# Patient Record
Sex: Male | Born: 1960 | Race: White | Hispanic: No | Marital: Married | Smoking: Never smoker
Health system: Southern US, Community
[De-identification: ages and names within clinical notes are randomized; demographics above are authoritative.]

## PROBLEM LIST (undated history)

## (undated) DIAGNOSIS — G4700 Insomnia, unspecified: Secondary | ICD-10-CM

## (undated) DIAGNOSIS — I10 Essential (primary) hypertension: Secondary | ICD-10-CM

## (undated) DIAGNOSIS — F419 Anxiety disorder, unspecified: Secondary | ICD-10-CM

## (undated) HISTORY — DX: Anxiety disorder, unspecified: F41.9

## (undated) HISTORY — PX: NO PAST SURGERIES: SHX2092

## (undated) HISTORY — DX: Essential (primary) hypertension: I10

## (undated) HISTORY — DX: Insomnia, unspecified: G47.00

---

## 2004-04-11 ENCOUNTER — Emergency Department (HOSPITAL_COMMUNITY): Admission: EM | Admit: 2004-04-11 | Discharge: 2004-04-11 | Payer: Self-pay | Admitting: Family Medicine

## 2004-09-11 ENCOUNTER — Emergency Department (HOSPITAL_COMMUNITY): Admission: EM | Admit: 2004-09-11 | Discharge: 2004-09-11 | Payer: Self-pay | Admitting: Family Medicine

## 2005-03-02 ENCOUNTER — Ambulatory Visit: Payer: Self-pay | Admitting: Internal Medicine

## 2005-07-20 ENCOUNTER — Ambulatory Visit: Payer: Self-pay | Admitting: Internal Medicine

## 2005-11-15 ENCOUNTER — Emergency Department (HOSPITAL_COMMUNITY): Admission: EM | Admit: 2005-11-15 | Discharge: 2005-11-15 | Payer: Self-pay | Admitting: Family Medicine

## 2006-12-13 ENCOUNTER — Ambulatory Visit: Payer: Self-pay | Admitting: Internal Medicine

## 2006-12-13 DIAGNOSIS — J309 Allergic rhinitis, unspecified: Secondary | ICD-10-CM | POA: Insufficient documentation

## 2006-12-13 DIAGNOSIS — F419 Anxiety disorder, unspecified: Secondary | ICD-10-CM | POA: Insufficient documentation

## 2007-01-02 ENCOUNTER — Ambulatory Visit: Payer: Self-pay | Admitting: Internal Medicine

## 2007-01-02 ENCOUNTER — Encounter: Payer: Self-pay | Admitting: Internal Medicine

## 2007-05-30 ENCOUNTER — Telehealth (INDEPENDENT_AMBULATORY_CARE_PROVIDER_SITE_OTHER): Payer: Self-pay | Admitting: *Deleted

## 2007-05-30 ENCOUNTER — Encounter (INDEPENDENT_AMBULATORY_CARE_PROVIDER_SITE_OTHER): Payer: Self-pay | Admitting: *Deleted

## 2007-06-26 ENCOUNTER — Telehealth (INDEPENDENT_AMBULATORY_CARE_PROVIDER_SITE_OTHER): Payer: Self-pay | Admitting: *Deleted

## 2007-07-29 ENCOUNTER — Telehealth: Payer: Self-pay | Admitting: Internal Medicine

## 2007-08-05 ENCOUNTER — Ambulatory Visit: Payer: Self-pay | Admitting: Internal Medicine

## 2008-02-26 ENCOUNTER — Encounter (INDEPENDENT_AMBULATORY_CARE_PROVIDER_SITE_OTHER): Payer: Self-pay | Admitting: *Deleted

## 2008-05-15 ENCOUNTER — Ambulatory Visit: Payer: Self-pay | Admitting: Internal Medicine

## 2008-05-25 ENCOUNTER — Emergency Department (HOSPITAL_COMMUNITY): Admission: EM | Admit: 2008-05-25 | Discharge: 2008-05-25 | Payer: Self-pay | Admitting: Family Medicine

## 2008-11-16 ENCOUNTER — Ambulatory Visit: Payer: Self-pay | Admitting: Internal Medicine

## 2008-11-19 LAB — CONVERTED CEMR LAB
Basophils Absolute: 0 10*3/uL (ref 0.0–0.1)
Bilirubin, Direct: 0.1 mg/dL (ref 0.0–0.3)
Calcium: 9.5 mg/dL (ref 8.4–10.5)
Cholesterol: 207 mg/dL (ref 0–200)
Creatinine, Ser: 1.1 mg/dL (ref 0.4–1.5)
Direct LDL: 144 mg/dL
GFR calc Af Amer: 92 mL/min
HCT: 46.2 % (ref 39.0–52.0)
Hemoglobin: 16.2 g/dL (ref 13.0–17.0)
Hgb A1c MFr Bld: 5.5 % (ref 4.6–6.0)
MCHC: 35.1 g/dL (ref 30.0–36.0)
MCV: 94.6 fL (ref 78.0–100.0)
Monocytes Absolute: 0.5 10*3/uL (ref 0.1–1.0)
Neutro Abs: 4.6 10*3/uL (ref 1.4–7.7)
PSA: 0.77 ng/mL (ref 0.10–4.00)
RDW: 12.2 % (ref 11.5–14.6)
Sodium: 138 meq/L (ref 135–145)
Total Bilirubin: 0.7 mg/dL (ref 0.3–1.2)
Total Protein: 7.4 g/dL (ref 6.0–8.3)

## 2009-08-13 ENCOUNTER — Telehealth (INDEPENDENT_AMBULATORY_CARE_PROVIDER_SITE_OTHER): Payer: Self-pay | Admitting: *Deleted

## 2009-12-21 ENCOUNTER — Ambulatory Visit: Payer: Self-pay | Admitting: Internal Medicine

## 2010-03-23 ENCOUNTER — Telehealth (INDEPENDENT_AMBULATORY_CARE_PROVIDER_SITE_OTHER): Payer: Self-pay | Admitting: *Deleted

## 2010-03-25 ENCOUNTER — Encounter: Payer: Self-pay | Admitting: Internal Medicine

## 2010-06-02 ENCOUNTER — Telehealth: Payer: Self-pay | Admitting: Internal Medicine

## 2010-06-14 ENCOUNTER — Telehealth (INDEPENDENT_AMBULATORY_CARE_PROVIDER_SITE_OTHER): Payer: Self-pay | Admitting: *Deleted

## 2010-06-16 ENCOUNTER — Encounter: Payer: Self-pay | Admitting: Internal Medicine

## 2010-07-07 ENCOUNTER — Telehealth (INDEPENDENT_AMBULATORY_CARE_PROVIDER_SITE_OTHER): Payer: Self-pay | Admitting: *Deleted

## 2010-07-21 ENCOUNTER — Encounter: Payer: Self-pay | Admitting: Internal Medicine

## 2010-07-21 ENCOUNTER — Telehealth: Payer: Self-pay | Admitting: Internal Medicine

## 2010-07-23 ENCOUNTER — Encounter: Payer: Self-pay | Admitting: Internal Medicine

## 2010-07-29 ENCOUNTER — Ambulatory Visit: Payer: Self-pay | Admitting: Internal Medicine

## 2010-10-10 ENCOUNTER — Emergency Department (HOSPITAL_COMMUNITY)
Admission: EM | Admit: 2010-10-10 | Discharge: 2010-10-10 | Payer: Self-pay | Source: Home / Self Care | Admitting: Family Medicine

## 2010-10-18 ENCOUNTER — Telehealth: Payer: Self-pay | Admitting: Internal Medicine

## 2010-11-10 NOTE — Progress Notes (Signed)
Summary: Miguel Brooks (in process)  Phone Note Refill Request Message from:  Fax from Pharmacy  Refills Requested: Medication #1:  LEXAPRO 10 MG TABS Take 1 1/2 tablet a day*OFFICE VISIT DUE NOW **. [facility], [address], [place] = [phone] PRIOR AUTHORIZATION----PLAN # IS [phone]    PATIENT ID# =[id number]  Initial call taken by: Jerolyn Shin,  June 14, 2010 10:46 AM  Follow-up for Phone Call        In process, faxing over forms to be filled out. Army Fossa CMA  June 14, 2010 3:53 PM  patient called to check if med was authorized - he is out of pills - please call him when ins responds his cell [phone] ok to lleave msg .Marland KitchenOkey Regal [place]  June 15, 2010 1:27 PM   Additional Follow-up for Phone Call Additional follow up Details #1::        Medication was approved, pt aware, refill sent to pharm. Army Fossa CMA  June 16, 2010 8:25 AM     Prescriptions: LEXAPRO 10 MG TABS (ESCITALOPRAM OXALATE) Take 1 1/2 tablet a day*OFFICE VISIT DUE NOW **  #45 x 1   Entered by:   Army Fossa CMA   Authorized by:   Nolon Rod. Paz MD   Signed by:   Army Fossa CMA on 06/16/2010   Method used:   Electronically to        [facility] #[id number]* (retail)       [address] [place], [place]  [address]       Ph: [phone]       Fax: [phone]   RxID:   [id number]

## 2010-11-10 NOTE — Progress Notes (Signed)
Summary: LEXAPRO REFILL---NEEDS PRIOR AUTH--PT IS OUT OF MED  Phone Note Refill Request Call back at [facility] [phone] Message from:  Fax from Pharmacy on July 21, 2010 11:36 AM  Refills Requested: Medication #1:  LEXAPRO 10 MG TABS Take 1 1/2 tablet a day*OFFICE VISIT DUE NOW **. [facility], [address], [place], [place] [phone]     *****NEEDS PRIOR AUTH****    [id number]    PT ID# [id number] PATIENT'S WIFE CALLED TO ASK ABOUT PRESCRIPTION---SAYS PATIENT IS OUT---HAS OFFICE VISIT SCHED FOR 10/21 AT 3:20  Initial call taken by: Jerolyn Shin,  July 21, 2010 11:38 AM  Follow-up for Phone Call        Forms requested. Liberty Stead CMA  July 21, 2010 11:59 AM   Forms completed and faxed, will await insurance company reply. Eden Rho CMA  July 21, 2010 3:28 PM      Appended Document: LEXAPRO REFILL---NEEDS PRIOR AUTH--PT IS OUT OF MED Approved until 2012.

## 2010-11-10 NOTE — Progress Notes (Signed)
Summary: appt--lmom 9/29--CALLED BACK  Phone Note Outgoing Call   Call placed by: Army Fossa CMA,  July 07, 2010 9:51 AM Summary of Call: Please call and have pt call make an appt for f/u. Army Fossa CMA  July 07, 2010 9:51 AM   Follow-up for Phone Call        [facility] ON [phone] TO SCHEDULE FOLLOWUP APPT--VERIFY [phone].Jerolyn Shin  July 07, 2010 11:59 AM  Additional Follow-up for Phone Call Additional follow up Details #1::        APPT ON 10/21 @3 :20--VERIFIED PHONE NUMBERS Additional Follow-up by: Jerolyn Shin,  July 07, 2010 5:07 PM

## 2010-11-10 NOTE — Assessment & Plan Note (Signed)
Summary: MED REFILL, FOLLOWUP APPT///SP[facility]   Vital Signs:  Patient profile:   50 year old male Weight:      189.13 pounds Pulse rate:   78 / minute Pulse rhythm:   regular BP sitting:   130 / 80  (left arm) Cuff size:   large  Vitals Entered By: Army Fossa CMA (July 29, 2010 3:27 PM) CC: Pt here for f/u on meds- working well  Comments [facility] [address]    History of Present Illness: followup from the last office visit, since then he is doing great. He switched jobs, is much happier. Anxiety   well controlled   ROS Good compliance with medication Still has a difficult time falling asleep, takes half Ambien from time to time He is exercising more but diet  has not changed. Has gained some weight.  Current Medications (verified): 1)  Zolpidem Tartrate 10 Mg Tabs (Zolpidem Tartrate) .Marland Kitchen.. 1 By Mouth At Bedtime As Needed 2)  Lexapro 10 Mg Tabs (Escitalopram Oxalate) .... Take 1 1/2 Tablet A Day*office Visit Due Now **  Allergies (verified): No Known Drug Allergies  Past History:  Past Medical History: ANXIETY  INSOMNIA   ALLERGIC RHINITIS    Past Surgical History: Reviewed history from 11/16/2008 and no changes required. no  Social History: Married 2 kids lost father  (01-2008) former Retail banker, now employed by [employer]  Physical Exam  General:  alert and well-developed.  has gained wt  since his last office visit Lungs:  normal respiratory effort, no intercostal retractions, no accessory muscle use, and normal breath sounds.   Heart:  normal rate, regular rhythm, no murmur, and no gallop.   Psych:  Oriented X3, memory intact for recent and remote, normally interactive, good eye contact, not anxious appearing, and not depressed appearing.     Impression & Recommendations:  Problem # 1:  ANXIETY STATE NOS (ICD-300.00) definitely improved We will decrease Lexapro from 15 mg to 10 mg daily, reassess in 6  months, decrease Lexapro to 5mg ?  His updated medication list for this problem includes:    Lexapro 10 Mg Tabs (Escitalopram oxalate) .Marland Kitchen... 1 by mouth once daily  Problem # 2:  INSOMNIA (ICD-780.52) still having problems from time to time, rf zolpidem to be taken as needed His updated medication list for this problem includes:    Zolpidem Tartrate 10 Mg Tabs (Zolpidem tartrate) .Marland Kitchen... 1 by mouth at bedtime as needed  Complete Medication List: 1)  Zolpidem Tartrate 10 Mg Tabs (Zolpidem tartrate) .Marland Kitchen.. 1 by mouth at bedtime as needed 2)  Lexapro 10 Mg Tabs (Escitalopram oxalate) .Marland Kitchen.. 1 by mouth once daily  Patient Instructions: 1)  Please schedule a follow-up appointment in 6 months .  Prescriptions: ZOLPIDEM TARTRATE 10 MG TABS (ZOLPIDEM TARTRATE) 1 by mouth at bedtime as needed  #30 x 2   Entered and Authorized by:   Nolon Rod. Paz MD   Signed by:   Nolon Rod. Paz MD on 07/29/2010   Method used:   Print then Give to Patient   RxID:   [id number] LEXAPRO 10 MG TABS (ESCITALOPRAM OXALATE) 1 by mouth once daily  #30 x 6   Entered and Authorized by:   Nolon Rod. Paz MD   Signed by:   Nolon Rod. Paz MD on 07/29/2010   Method used:   Electronically to        [facility] #[id number]* (retail)  [address]       [place], [place]  [address]       Ph: [phone]       Fax: [phone]   RxID:   [id number]    Orders Added: 1)  Est. Patient Level III [60109]   Immunization History:  Influenza Immunization History:    Influenza:  historical (07/13/2010)   Immunization History:  Influenza Immunization History:    Influenza:  Historical (07/13/2010)

## 2010-11-10 NOTE — Medication Information (Signed)
Summary: Approval for Lexapro/[employer]  Approval for Lexapro/[employer]   Imported By: Lanelle Bal 08/03/2010 09:36:35  _____________________________________________________________________  External Attachment:    Type:   Image     Comment:   External Document

## 2010-11-10 NOTE — Progress Notes (Signed)
Summary: Refill Request/Med Change  Phone Note Refill Request Call back at [phone] Message from:  Hulan Fray on October 18, 2010 8:39 AM  Refills Requested: Medication #1:  LEXAPRO 10 MG TABS 1 by mouth once daily.   Dosage confirmed as above?Dosage Confirmed   Supply Requested: 45 Patient was starting to see negativer changes after 2 weeks of only taking 1 tablet a day and went back to taking 1 & 1/2 daily. He is now running low and needs a refill for #45, not #30. He wanted to make Dr. Drue Novel aware of the medication change as well. If any question call wife, Olegario Messier @ [phone].  [facility] [address]   Initial call taken by: Harold Barban,  October 18, 2010 8:40 AM  Follow-up for Phone Call        okay to go back to 15 mg of Lexapro. Called refills for 6 months Follow-up by: Taygen Newsome E. Rynlee Lisbon MD,  October 18, 2010 9:27 AM    New/Updated Medications: LEXAPRO 10 MG TABS (ESCITALOPRAM OXALATE) 1 1/2  by mouth once daily Prescriptions: LEXAPRO 10 MG TABS (ESCITALOPRAM OXALATE) 1 1/2  by mouth once daily  #45 x 5   Entered by:   Army Fossa CMA   Authorized by:   Nolon Rod. Yousaf Sainato MD   Signed by:   Army Fossa CMA on 10/18/2010   Method used:   Electronically to        [facility] #[id number]* (retail)       [address] [place], [place]  [address]       Ph: [phone]       Fax: [phone]   RxID:   [id number]

## 2010-11-10 NOTE — Medication Information (Signed)
Summary: Prior Authorization for Lexapro/[employer]  Prior Authorization for Lexapro/[employer]   Imported By: Lanelle Bal 07/28/2010 15:53:30  _____________________________________________________________________  External Attachment:    Type:   Image     Comment:   External Document

## 2010-11-10 NOTE — Progress Notes (Signed)
Summary: pa IN PROCESS [employer]-lexapro approved   Phone Note Refill Request Message from:  Pharmacy on March 23, 2010 2:27 PM  Refills Requested: Medication #1:  LEXAPRO 10 MG TABS 1/2 tablet once daily x 1week. Then 1 tab a day x 3 weeks. Then 1/5 tablet a day. Prior Auth [id number] Id#: [id number] [facility] [address]  Initial call taken by: Harold Barban,  March 23, 2010 2:27 PM  Follow-up for Phone Call        prior auth in process awaiting fax...........Marland KitchenFelecia Deloach CMA  March 23, 2010 3:33 PM  prior auth faxed back awaiting response.Felecia Deloach CMA  March 23, 2010 5:17 PM.   lexapro approved.Marland KitchenMarland KitchenDoristine Devoid  March 28, 2010 8:35 AM     New/Updated Medications: LEXAPRO 10 MG TABS (ESCITALOPRAM OXALATE) Take 1 1/2 tablet a day*OFFICE VISIT DUE NOW ** Prescriptions: LEXAPRO 10 MG TABS (ESCITALOPRAM OXALATE) Take 1 1/2 tablet a day*OFFICE VISIT DUE NOW **  #45 x 0   Entered by:   Jeremy Johann CMA   Authorized by:   Nolon Rod. Paz MD   Signed by:   Jeremy Johann CMA on 03/23/2010   Method used:   Re-Faxed to ...       [facility] [address] #[id number]* (retail)       [address] [place], [place]  [address]       Ph: [phone]       Fax: [phone]   RxID:   [id number]

## 2010-11-10 NOTE — Assessment & Plan Note (Signed)
Summary: DISCUSS LEXAPRO/RH.......Marland Kitchen   Vital Signs:  Patient profile:   50 year old male Height:      71.5 inches Weight:      178.2 pounds BMI:     24.60 Pulse rate:   84 / minute BP sitting:   120 / 80  Vitals Entered By: Shary Decamp (December 21, 2009 8:10 AM) CC: stress Comments  - weaned off lexapro about 1 month ago because he didn't feel like it was working  - increased stress with work & family  - pt states his mind is "always thinking" Shary Decamp  December 21, 2009 8:11 AM    History of Present Illness: the patient has been on Lexapro for a while and doing well (see  last office visit) financially she started with some problems in September an even with Lexapro he felt slightly anxious and depressed few weeks ago  he felt that he could do better with a Lexapro so he tapered himself down and he is completely off Lexapro for 3 weeks at this point he recognizes that he is more anxious, he is not sleeping well, he is a slightly depressed. He has a negative outlook in the future and has a decreased focus.  Current Medications (verified): 1)  Zolpidem Tartrate 10 Mg Tabs (Zolpidem Tartrate) .Marland Kitchen.. 1 By Mouth At Bedtime As Needed  Allergies (verified): No Known Drug Allergies  Past History:  Past Medical History: Reviewed history from 11/16/2008 and no changes required. ANXIETY STATE NOS  INSOMNIA   ALLERGIC RHINITIS (ICD-477.9)  Past Surgical History: Reviewed history from 11/16/2008 and no changes required. 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   Psych:  Oriented X3, memory intact for recent and remote, normally interactive, good eye contact, and not depressed appearing.   slightly anxious    Impression & Recommendations:  Problem # 1:  ANXIETY STATE NOS (ICD-300.00) we have a long conversation about his situation on looking back, he was doing okay on Lexapro 10 mg  until he faced financial stress.   He did not experience fatigue, sexual side effects or any other side effects on 10 mg of lexapro His options are -- restart Lexapro, goal 15 mg -- psychiatry referral -- counseling -- counseling and Lexapro patient elected counseling  and  Lexapro, will restart Lexapro gradually as he had side effects when he started 10 mg he will set up an appointment with a counselor in his church samples of lexapro #42  The following medications were removed from the medication list:    Lexapro 10 Mg Tabs (Escitalopram oxalate) .Marland Kitchen... 1 by mouth qd His updated medication list for this problem includes:    Lexapro 10 Mg Tabs (Escitalopram oxalate) .Marland Kitchen... 1/2 tablet once daily x 1week. then 1 tab a day x 3 weeks. then 1/5 tablet a day  Problem # 2:  face-to-face more than 15 minutes, more than 50% of the time counseling  Complete Medication List: 1)  Zolpidem Tartrate 10 Mg Tabs (Zolpidem tartrate) .Marland Kitchen.. 1 by mouth at bedtime as needed 2)  Lexapro 10 Mg Tabs (Escitalopram oxalate) .... 1/2 tablet once daily x 1week. then 1 tab a day x 3 weeks. then 1/5 tablet a day  Patient Instructions: 1)  lexapro 10mg : 1/2 tablet once daily x 1week. Then 1 tab a day x 3 weeks. Then 1/5 tablet a day  2)  Please schedule a follow-up appointment  in 2 months.  Prescriptions: LEXAPRO 10 MG TABS (ESCITALOPRAM OXALATE) 1/2 tablet once daily x 1week. Then 1 tab a day x 3 weeks. Then 1/5 tablet a day  #60 x 0   Entered and Authorized by:   Nolon Rod. Thessaly Mccullers MD   Signed by:   Nolon Rod. Kathia Covington MD on 12/21/2009   Method used:   Print then Give to Patient   RxID:   [id number] ZOLPIDEM TARTRATE 10 MG TABS (ZOLPIDEM TARTRATE) 1 by mouth at bedtime as needed  #30 x 6   Entered by:   Shary Decamp   Authorized by:   Nolon Rod. Daekwon Beswick MD   Signed by:   Shary Decamp on 12/21/2009   Method used:   Printed then faxed to ...       [facility] [address] #[id number]* (retail)       [address] [place], [place]  [address]       Ph: [phone]       Fax: [phone]   RxID:   [id number]

## 2010-11-10 NOTE — Medication Information (Signed)
Summary: Prior Authorization & Approval for Lexapro/[employer]  Prior Authorization & Approval for Lexapro/[employer]   Imported By: Lanelle Bal 03/31/2010 13:14:03  _____________________________________________________________________  External Attachment:    Type:   Image     Comment:   External Document

## 2010-11-10 NOTE — Progress Notes (Signed)
Summary: rx  Phone Note Call from Patient Call back at [phone]--cell   Caller: Patient Summary of Call: need a new scipt call in for lexapro  to be call in. need more than 15 pill call. 30 or 60 day supply. [facility] in to [facility] [place] [address] Initial call taken by: Freddy Jaksch,  June 02, 2010 1:46 PM  Follow-up for Phone Call        ok call #60  , no RF. Remind patient is due for OV Follow-up by: Jose E. Paz MD,  June 03, 2010 8:05 AM  Additional Follow-up for Phone Call Additional follow up Details #1::        Left detailed message for pt that refill was called in and we needed him to call back to make an appt. Army Fossa CMA  June 03, 2010 8:22 AM     Prescriptions: LEXAPRO 10 MG TABS (ESCITALOPRAM OXALATE) Take 1 1/2 tablet a day*OFFICE VISIT DUE NOW **  #60 x 0   Entered by:   Army Fossa CMA   Authorized by:   Nolon Rod. Paz MD   Signed by:   Army Fossa CMA on 06/03/2010   Method used:   Electronically to        [facility] [address] [phone]* (retail)       [address], [place]  [address]       Ph: [phone]       Fax: [phone]   RxID:   [id number]

## 2010-11-11 NOTE — Medication Information (Signed)
Summary: Approval for Lexapro/[employer]  Approval for Lexapro/[employer]   Imported By: Lanelle Bal 06/28/2010 08:41:05  _____________________________________________________________________  External Attachment:    Type:   Image     Comment:   External Document

## 2011-02-14 ENCOUNTER — Other Ambulatory Visit: Payer: Self-pay | Admitting: Internal Medicine

## 2011-02-14 NOTE — Telephone Encounter (Signed)
Ok 30, 2 RF. Also tell pt he is due for a check up, no urgent

## 2011-02-28 ENCOUNTER — Ambulatory Visit (INDEPENDENT_AMBULATORY_CARE_PROVIDER_SITE_OTHER): Payer: 59 | Admitting: Internal Medicine

## 2011-02-28 ENCOUNTER — Encounter: Payer: Self-pay | Admitting: Internal Medicine

## 2011-02-28 DIAGNOSIS — Z0000 Encounter for general adult medical examination without abnormal findings: Secondary | ICD-10-CM | POA: Insufficient documentation

## 2011-02-28 DIAGNOSIS — F411 Generalized anxiety disorder: Secondary | ICD-10-CM

## 2011-02-28 DIAGNOSIS — Z136 Encounter for screening for cardiovascular disorders: Secondary | ICD-10-CM

## 2011-02-28 DIAGNOSIS — J309 Allergic rhinitis, unspecified: Secondary | ICD-10-CM

## 2011-02-28 LAB — CBC WITH DIFFERENTIAL/PLATELET
Basophils Absolute: 0 10*3/uL (ref 0.0–0.1)
Eosinophils Relative: 2.9 % (ref 0.0–5.0)
HCT: 43.5 % (ref 39.0–52.0)
Lymphocytes Relative: 22.9 % (ref 12.0–46.0)
Lymphs Abs: 1.7 10*3/uL (ref 0.7–4.0)
Monocytes Relative: 6.7 % (ref 3.0–12.0)
Platelets: 294 10*3/uL (ref 150.0–400.0)
WBC: 7.6 10*3/uL (ref 4.5–10.5)

## 2011-02-28 LAB — LIPID PANEL
HDL: 39.4 mg/dL (ref >39.00)
LDL Cholesterol: 126 mg/dL — ABNORMAL HIGH (ref 0–99)
Total CHOL/HDL Ratio: 5

## 2011-02-28 LAB — BASIC METABOLIC PANEL
CO2: 29 mEq/L (ref 19–32)
Chloride: 105 mEq/L (ref 96–112)
Glucose, Bld: 84 mg/dL (ref 70–99)
Potassium: 4.1 mEq/L (ref 3.5–5.1)
Sodium: 141 mEq/L (ref 135–145)

## 2011-02-28 LAB — AST: AST: 24 U/L (ref 0–37)

## 2011-02-28 LAB — ALT: ALT: 25 U/L (ref 0–53)

## 2011-02-28 MED ORDER — FLUTICASONE FUROATE 27.5 MCG/SPRAY NA SUSP: 2.0000 | Freq: Every day | NASAL | Status: DC

## 2011-02-28 NOTE — Progress Notes (Signed)
  Subjective:    Patient ID: Miguel Brooks, male    DOB: 1961/07/11, 50 y.o.   MRN: [id number]  HPI Complete physical exam Complaints of sinus congestion and allergy symptoms despite using Zyrtec D. Wonders about a nasal steroid.  Past Medical History  Diagnosis Date  . Anxiety   . Insomnia   . Allergic rhinitis    No past surgical history on file.  Family History: colon ca--no prostate ca--no mixo-fibro-sarcoma-- father DM-- father  MI-- mother at 60  Social History: Married 2 kids, 2 GK lost father  (01-2008) Tobacco-- no ETOH-- rarely  Not exercising much Diet ok  Review of Systems No chest pain or shortness of breath No cough or chest congestion No nausea, vomiting, diarrhea. No blood in the stools. No dysuria or difficulty urinating. Denies anxiety or depression, has a great relationship with his wife    Objective:   Physical Exam  Constitutional: He is oriented to person, place, and time. He appears well-developed and well-nourished. No distress.  HENT:  Head: Normocephalic and atraumatic.  Nose: Nose normal.  Neck: No thyromegaly present.       Normal carotid pulses  Cardiovascular: Normal rate, regular rhythm and normal heart sounds.   No murmur heard. Pulmonary/Chest: Effort normal and breath sounds normal. No respiratory distress. He has no wheezes. He has no rales.  Abdominal: Soft. Bowel sounds are normal. He exhibits no distension. There is no tenderness. There is no rebound and no guarding.  Musculoskeletal: He exhibits no edema.  Neurological: He is oriented to person, place, and time.  Skin: Skin is warm and dry.  Psychiatric: He has a normal mood and affect. His behavior is normal. Thought content normal.          Assessment & Plan:

## 2011-02-28 NOTE — Assessment & Plan Note (Signed)
Doing well Update on his tetanus shot Continue with healthy diet, try to exercise more. EKG today normal. Labs.

## 2011-02-28 NOTE — Assessment & Plan Note (Addendum)
See history of present illness, symptoms not well-controlled. Trial  with Veramyst

## 2011-02-28 NOTE — Assessment & Plan Note (Signed)
Well controlled  with present meds, will RF meds whenever needed

## 2011-03-02 ENCOUNTER — Telehealth: Payer: Self-pay | Admitting: *Deleted

## 2011-03-02 NOTE — Telephone Encounter (Signed)
Message copied by Army Fossa on Thu Mar 02, 2011  9:14 AM ------      Message from: Willow Ora      Created: Wed Mar 01, 2011  5:12 PM       Advise patient:      His cholesterol has improved, only the TG are slt elevated but the LDL or bad cholesterol is now 124. At goal.      All other labs normal.      Good results !

## 2011-03-02 NOTE — Telephone Encounter (Signed)
Message left for patient to return my call.  

## 2011-03-03 NOTE — Telephone Encounter (Signed)
Pt is aware.  

## 2011-04-18 ENCOUNTER — Other Ambulatory Visit: Payer: Self-pay | Admitting: *Deleted

## 2011-04-18 MED ORDER — ESCITALOPRAM OXALATE 10 MG PO TABS: ORAL_TABLET | ORAL | Status: DC

## 2011-05-16 ENCOUNTER — Other Ambulatory Visit: Payer: Self-pay | Admitting: Internal Medicine

## 2011-05-17 NOTE — Telephone Encounter (Signed)
Ok #90, 1 RF . Please correct the prescription (delete the "needs an appointment")

## 2011-05-18 ENCOUNTER — Other Ambulatory Visit: Payer: Self-pay | Admitting: Internal Medicine

## 2011-05-18 NOTE — Telephone Encounter (Signed)
Please RF 

## 2011-05-18 NOTE — Telephone Encounter (Signed)
Patient called at noon today (8/9) to check on status of this prescription for Zolpidem---he is out and needs to pick prescription up after work today around 5:00PM--verified that pharmacy is correct

## 2011-05-19 ENCOUNTER — Other Ambulatory Visit: Payer: Self-pay | Admitting: *Deleted

## 2011-05-19 MED ORDER — ZOLPIDEM TARTRATE 10 MG PO TABS: 10.0000 mg | ORAL_TABLET | Freq: Every evening | ORAL | Status: DC | PRN

## 2011-07-13 ENCOUNTER — Other Ambulatory Visit: Payer: Self-pay | Admitting: Internal Medicine

## 2011-07-18 ENCOUNTER — Other Ambulatory Visit: Payer: Self-pay | Admitting: Internal Medicine

## 2011-07-19 NOTE — Telephone Encounter (Signed)
Ok 30, 6 RF 

## 2011-07-19 NOTE — Telephone Encounter (Signed)
Done

## 2011-10-16 ENCOUNTER — Other Ambulatory Visit: Payer: Self-pay | Admitting: Internal Medicine

## 2011-11-15 ENCOUNTER — Other Ambulatory Visit: Payer: Self-pay | Admitting: Internal Medicine

## 2011-11-16 NOTE — Telephone Encounter (Signed)
Refill done.  

## 2012-01-15 ENCOUNTER — Ambulatory Visit (INDEPENDENT_AMBULATORY_CARE_PROVIDER_SITE_OTHER): Payer: BC Managed Care – PPO | Admitting: Internal Medicine

## 2012-01-15 VITALS — BP 130/86 | HR 82 | Temp 98.2°F | Wt 192.0 lb

## 2012-01-15 DIAGNOSIS — K1120 Sialoadenitis, unspecified: Secondary | ICD-10-CM

## 2012-01-15 MED ORDER — AMOXICILLIN 500 MG PO CAPS: 1000.0000 mg | ORAL_CAPSULE | Freq: Two times a day (BID) | ORAL | Status: AC

## 2012-01-15 NOTE — Patient Instructions (Signed)
Parotitis  Parotitis is the irritation and puffiness (swelling) of one or both of the parotid glands. This is the main salivary gland in the mouth. Parotitis will cause the ear to be pushed up and out. HOME CARE  Put ice on the area.   Put ice in a plastic bag.   Place a towel between your skin and the bag.   Leave the ice on for 15 to 20 minutes, 3 to 4 times a day.   Only take medicine as told by your doctor.  GET HELP RIGHT AWAY IF:  You have more pain and puffiness in your gland that is not helped with medicine.   You have a fever.  MAKE SURE YOU:  Understand these instructions.   Will watch your condition.   Will get help right away if you are not doing well or get worse.  Document Released: 10/28/2010 Document Revised: 09/14/2011 Document Reviewed: 08/21/2011 [facility] Patient Information 2012 [place], [place].  Amoxicillin as prescribed Lemon or lime as needed

## 2012-01-15 NOTE — Progress Notes (Signed)
  Subjective:    Patient ID: Miguel Brooks, male    DOB: August 04, 1961, 51 y.o.   MRN: [id number]  HPI Acute visit 3 days ago noted swelling at the left side of the face. There was no pain, the area is less swelling now compared to last week.  Past Medical History  Diagnosis Date  . Anxiety   . Insomnia   . Allergic rhinitis      Review of Systems Denies any fever or chills. No sore throat or ear ache. No rash in the face. No dental pain.     Objective:   Physical Exam  Constitutional: He appears well-developed and well-nourished. No distress.  HENT:  Head: Normocephalic and atraumatic.    Right Ear: External ear normal.  Nose: Nose normal.  Mouth/Throat: Oropharynx is clear and moist.       Left tympanic membrane is slightly bulged but not red. Teeth not tender to percussion  Eyes: Conjunctivae are normal.  Skin: No rash noted. He is not diaphoretic.       Assessment & Plan:  Facial swelling most likely from parotitis, symptoms are now spontaneously getting better. Problem may be viral, less likely bacterial, due to a stone?. Plan: Antibiotics to suck on limes or lemons twice a day to call if not completely back to normal in a few days

## 2012-01-16 ENCOUNTER — Encounter: Payer: Self-pay | Admitting: Internal Medicine

## 2012-01-22 ENCOUNTER — Other Ambulatory Visit: Payer: Self-pay | Admitting: Internal Medicine

## 2012-01-22 NOTE — Telephone Encounter (Signed)
Refill request lexapro 10mg  #45 with 1 refill. Last refilled 2.6.13. OK to refill?

## 2012-01-23 NOTE — Telephone Encounter (Signed)
OK to fill? Please advise.

## 2012-01-23 NOTE — Telephone Encounter (Signed)
Pts wife called stating the pt needs this medication before 5 today. Please call her on her cell # [phone] when it has been sent.

## 2012-01-23 NOTE — Telephone Encounter (Signed)
Refill done.  

## 2012-02-19 ENCOUNTER — Telehealth: Payer: Self-pay | Admitting: Internal Medicine

## 2012-02-19 MED ORDER — ZOLPIDEM TARTRATE 10 MG PO TABS: ORAL_TABLET | ORAL | Status: DC

## 2012-02-19 NOTE — Telephone Encounter (Signed)
Refill: Zolpidem 10mg  tablet. Take 1 tablet by mouth every night at bedtime as needed for sleep. Qty 30. Last fill 01-14-12

## 2012-02-19 NOTE — Telephone Encounter (Signed)
Spoke with patient, patient aware rx responded to today. RX called into pharmacy

## 2012-02-19 NOTE — Telephone Encounter (Signed)
#

## 2012-02-19 NOTE — Telephone Encounter (Signed)
Ok to refill 

## 2012-02-20 ENCOUNTER — Other Ambulatory Visit: Payer: Self-pay | Admitting: Internal Medicine

## 2012-03-16 ENCOUNTER — Other Ambulatory Visit: Payer: Self-pay | Admitting: Internal Medicine

## 2012-04-19 ENCOUNTER — Other Ambulatory Visit: Payer: Self-pay | Admitting: Internal Medicine

## 2012-04-21 NOTE — Telephone Encounter (Signed)
Ok #30, 3 RF 

## 2012-04-22 NOTE — Telephone Encounter (Signed)
Rx sent 

## 2012-09-04 ENCOUNTER — Ambulatory Visit (INDEPENDENT_AMBULATORY_CARE_PROVIDER_SITE_OTHER): Payer: BC Managed Care – PPO | Admitting: Radiology

## 2012-09-04 DIAGNOSIS — Z23 Encounter for immunization: Secondary | ICD-10-CM

## 2012-09-13 ENCOUNTER — Other Ambulatory Visit: Payer: Self-pay | Admitting: Internal Medicine

## 2012-09-16 NOTE — Telephone Encounter (Signed)
Left detailed msg on pt's vmail.  

## 2012-09-16 NOTE — Telephone Encounter (Signed)
Call patient, make an appointment within 6 weeks for a complete physical exam. ok #30 and 1, no further refills with an appointment, let patient know

## 2012-09-16 NOTE — Telephone Encounter (Signed)
Ok to refill 

## 2012-12-08 ENCOUNTER — Telehealth: Payer: Self-pay | Admitting: Internal Medicine

## 2012-12-09 NOTE — Telephone Encounter (Signed)
i did #30, no RF, has an appointment for tomorrow

## 2012-12-09 NOTE — Telephone Encounter (Signed)
Ok to refill? Last OV 4.8.13 Last filled 12.6.13

## 2012-12-10 ENCOUNTER — Encounter: Payer: Self-pay | Admitting: Internal Medicine

## 2012-12-10 ENCOUNTER — Telehealth: Payer: Self-pay | Admitting: Internal Medicine

## 2012-12-10 ENCOUNTER — Ambulatory Visit (INDEPENDENT_AMBULATORY_CARE_PROVIDER_SITE_OTHER): Payer: BC Managed Care – PPO | Admitting: Internal Medicine

## 2012-12-10 VITALS — BP 136/84 | HR 70 | Ht 72.5 in | Wt 188.0 lb

## 2012-12-10 DIAGNOSIS — Z0000 Encounter for general adult medical examination without abnormal findings: Secondary | ICD-10-CM

## 2012-12-10 LAB — COMPREHENSIVE METABOLIC PANEL
AST: 20 U/L (ref 0–37)
Alkaline Phosphatase: 64 U/L (ref 39–117)
BUN: 18 mg/dL (ref 6–23)
Calcium: 9.3 mg/dL (ref 8.4–10.5)
Creatinine, Ser: 1.1 mg/dL (ref 0.4–1.5)
Total Bilirubin: 0.8 mg/dL (ref 0.3–1.2)

## 2012-12-10 LAB — PSA: PSA: 0.74 ng/mL (ref 0.10–4.00)

## 2012-12-10 LAB — LIPID PANEL
HDL: 36.3 mg/dL — ABNORMAL LOW (ref >39.00)
VLDL: 24.4 mg/dL (ref 0.0–40.0)

## 2012-12-10 LAB — CBC WITH DIFFERENTIAL/PLATELET
Basophils Relative: 0.4 % (ref 0.0–3.0)
Eosinophils Absolute: 0.2 10*3/uL (ref 0.0–0.7)
HCT: 45.2 % (ref 39.0–52.0)
Hemoglobin: 15.6 g/dL (ref 13.0–17.0)
Lymphocytes Relative: 12.6 % (ref 12.0–46.0)
Lymphs Abs: 1.3 10*3/uL (ref 0.7–4.0)
MCHC: 34.4 g/dL (ref 30.0–36.0)
MCV: 92.9 fl (ref 78.0–100.0)
Neutro Abs: 8.7 10*3/uL — ABNORMAL HIGH (ref 1.4–7.7)
RBC: 4.87 Mil/uL (ref 4.22–5.81)

## 2012-12-10 LAB — TSH: TSH: 1.05 u[IU]/mL (ref 0.35–5.50)

## 2012-12-10 MED ORDER — ESCITALOPRAM OXALATE 10 MG PO TABS: ORAL_TABLET | ORAL | Status: DC

## 2012-12-10 MED ORDER — ZOLPIDEM TARTRATE 10 MG PO TABS: ORAL_TABLET | ORAL | Status: DC

## 2012-12-10 NOTE — Progress Notes (Signed)
  Subjective:    Patient ID: Miguel Brooks, male    DOB: 11/03/1960, 52 y.o.   MRN: [id number]  HPI CPX  Past Medical History  Diagnosis Date  . Anxiety   . Insomnia   . Allergic rhinitis    Past Surgical History  Procedure Laterality Date  . No past surgeries     History   Social History  . Marital Status: Single    Spouse Name: N/A    Number of Children: 2  . Years of Education: N/A   Occupational History  . sales    .     Social History Main Topics  . Smoking status: Never Smoker   . Smokeless tobacco: Never Used  . Alcohol Use: Yes     Comment: rare  . Drug Use: No  . Sexually Active: Not on file   Other Topics Concern  . Not on file   Social History Narrative   Diet: usually healthy   Exercise: not much exercise lately   2 children (from wife), 3 Gkids               Family History  Problem Relation Age of Onset  . Colon cancer Neg Hx   . Prostate cancer Neg Hx   . Diabetes Father     borderline  . Heart attack Mother 55    pass away  . Stroke Neg Hx     Review of Systems  Respiratory: Negative for cough and shortness of breath.   Cardiovascular: Negative for chest pain and leg swelling.  Gastrointestinal: Negative for nausea, abdominal pain, diarrhea and blood in stool.  Genitourinary: Negative for dysuria, hematuria and difficulty urinating.  Psychiatric/Behavioral:       Sx well controlled        Objective:   Physical Exam General -- alert, well-developed, BMI 25  .   Neck --no thyromegaly , normal carotid pulse Lungs -- normal respiratory effort, no intercostal retractions, no accessory muscle use, and normal breath sounds.   Heart-- normal rate, regular rhythm, no murmur, and no gallop.   Abdomen--soft, non-tender, no distention, no masses, no HSM, no guarding, and no rigidity.   Extremities-- no pretibial edema bilaterally Rectal-- No external abnormalities noted. Normal sphincter tone. No rectal masses or tenderness. Worster  stool, Hemoccult negative Prostate:  Prostate gland firm and smooth, no enlargement, nodularity, tenderness, mass, asymmetry or induration. Neurologic-- alert & oriented X3 and strength normal in all extremities. Psych-- Cognition and judgment appear intact. Alert and cooperative with normal attention span and concentration.  not anxious appearing and not depressed appearing.       Assessment & Plan:

## 2012-12-10 NOTE — Assessment & Plan Note (Addendum)
Td 2006 Doing well cscope-- never , discussed Cscope vs iFOB, likes a scope, referral done  Labs  Diet very good, exercise discussed  RTC 1 year Chronic medical problems well-controlled.

## 2012-12-10 NOTE — Telephone Encounter (Signed)
stopped by check out to schedule physical for next yr., wanted me to send reminder to Dr.paz to enter referral for Colonoscopy

## 2012-12-10 NOTE — Telephone Encounter (Signed)
I will.

## 2012-12-12 ENCOUNTER — Encounter: Payer: Self-pay | Admitting: *Deleted

## 2013-02-20 ENCOUNTER — Encounter: Payer: Self-pay | Admitting: Internal Medicine

## 2013-02-21 ENCOUNTER — Encounter: Payer: Self-pay | Admitting: Internal Medicine

## 2013-03-04 ENCOUNTER — Encounter: Payer: Self-pay | Admitting: Internal Medicine

## 2013-03-04 ENCOUNTER — Telehealth: Payer: Self-pay | Admitting: Internal Medicine

## 2013-03-04 NOTE — Telephone Encounter (Signed)
In reference to Gastroenterology referral entered on 12/10/12, [facility] attempted to reach patient, left message for return call and no response.  I have attempted to reach patient several times by phone, left messages, and mailed patient a letter.  As of today, 03/04/13, patient will not respond.

## 2013-03-04 NOTE — Telephone Encounter (Signed)
Noted , we'll discuss on return to the office 

## 2013-05-28 ENCOUNTER — Telehealth: Payer: Self-pay | Admitting: Internal Medicine

## 2013-06-10 NOTE — Telephone Encounter (Signed)
error 

## 2013-07-13 ENCOUNTER — Other Ambulatory Visit: Payer: Self-pay | Admitting: Internal Medicine

## 2013-07-14 ENCOUNTER — Telehealth: Payer: Self-pay | Admitting: *Deleted

## 2013-07-14 NOTE — Telephone Encounter (Signed)
zolpidem (AMBIEN) 10 MG tablet Last OV: 12/10/2012 Last refill: 12/10/2012 No UDS on file

## 2013-07-14 NOTE — Telephone Encounter (Signed)
Okay to send #30 and one refill. Although I did not d/w at the time of his physical, he needs a contract, UDS per office policy;  please arrange at the patient's earliest convenience

## 2013-07-15 ENCOUNTER — Other Ambulatory Visit: Payer: Self-pay | Admitting: *Deleted

## 2013-07-15 ENCOUNTER — Telehealth: Payer: Self-pay | Admitting: *Deleted

## 2013-07-15 MED ORDER — ZOLPIDEM TARTRATE 10 MG PO TABS: ORAL_TABLET | ORAL | Status: DC

## 2013-07-15 NOTE — Telephone Encounter (Signed)
Ambien refilled and UDS contract printed for patient to sign.

## 2013-07-15 NOTE — Telephone Encounter (Signed)
Called and spoke with patient to inform him that his prescription for Ambien is available for pick up at our front desk. Also, that he needs to review and sign Controlled Substance Contract, urine sample required.

## 2013-08-14 ENCOUNTER — Other Ambulatory Visit: Payer: Self-pay

## 2013-09-21 ENCOUNTER — Other Ambulatory Visit: Payer: Self-pay | Admitting: Internal Medicine

## 2013-09-22 ENCOUNTER — Telehealth: Payer: Self-pay | Admitting: *Deleted

## 2013-09-22 MED ORDER — ZOLPIDEM TARTRATE 10 MG PO TABS: ORAL_TABLET | ORAL | Status: DC

## 2013-09-22 NOTE — Telephone Encounter (Signed)
rx refill- Ambien 10 mg Last ov- 12/10/12 Last refill- 07/15/13 #30 / 1 rf  UDS contract- 07/25/13

## 2013-09-22 NOTE — Telephone Encounter (Signed)
Okay to refill Ambien, see prescription. Does not need a UDS, he is only taking Ambien

## 2013-09-22 NOTE — Addendum Note (Signed)
Addended by: Willow Ora E on: 09/22/2013 12:54 PM   Modules accepted: Orders

## 2013-10-06 ENCOUNTER — Emergency Department (INDEPENDENT_AMBULATORY_CARE_PROVIDER_SITE_OTHER)
Admission: EM | Admit: 2013-10-06 | Discharge: 2013-10-06 | Disposition: A | Discharge status: Home / Self Care | Payer: BC Managed Care – PPO | Source: Home / Self Care

## 2013-10-06 ENCOUNTER — Encounter (HOSPITAL_COMMUNITY): Payer: Self-pay | Admitting: Emergency Medicine

## 2013-10-06 DIAGNOSIS — J329 Chronic sinusitis, unspecified: Secondary | ICD-10-CM

## 2013-10-06 MED ORDER — METHYLPREDNISOLONE 4 MG PO KIT: PACK | ORAL | Status: DC

## 2013-10-06 MED ORDER — AMOXICILLIN 500 MG PO CAPS: 1000.0000 mg | ORAL_CAPSULE | Freq: Two times a day (BID) | ORAL | Status: DC

## 2013-10-06 NOTE — ED Provider Notes (Signed)
CSN: [id number]     Arrival date & time 10/06/13  0906 History   First MD Initiated Contact with Patient 10/06/13 0945     Chief Complaint  Patient presents with  . Sinusitis   (Consider location/radiation/quality/duration/timing/severity/associated sxs/prior Treatment) HPI Comments: 52 year old male states 2 weeks ago that he was raking mulch in [place] and at the end of the day developed acute sinus congestion. Sinus congestion has persisted through the past 2 weeks and is experiencing sinus headache and pressure in the face. He describes yellow and green PND. He denies fever but has occasional frequent cough. He has been taking Mucinex D. and Mucinex DM with little to no benefit.   Past Medical History  Diagnosis Date  . Anxiety   . Insomnia   . Allergic rhinitis    Past Surgical History  Procedure Laterality Date  . No past surgeries     Family History  Problem Relation Age of Onset  . Colon cancer Neg Hx   . Prostate cancer Neg Hx   . Diabetes Father     borderline  . Heart attack Mother 77    pass away  . Stroke Neg Hx    History  Substance Use Topics  . Smoking status: Never Smoker   . Smokeless tobacco: Never Used  . Alcohol Use: Yes     Comment: rare    Review of Systems  Constitutional: Negative for fever, diaphoresis, activity change and fatigue.  HENT: Positive for congestion, postnasal drip, sore throat and trouble swallowing. Negative for ear pain, facial swelling and rhinorrhea.   Eyes: Negative for pain, discharge and redness.  Respiratory: Positive for cough. Negative for chest tightness, shortness of breath and wheezing.   Cardiovascular: Negative.   Gastrointestinal: Negative.   Musculoskeletal: Negative.  Negative for neck pain and neck stiffness.  Neurological: Negative.     Allergies  Aleve  Home Medications   Current Outpatient Rx  Name  Route  Sig  Dispense  Refill  . amoxicillin (AMOXIL) 500 MG capsule   Oral   Take 2 capsules (1,000  mg total) by mouth 2 (two) times daily.   28 capsule   0   . escitalopram (LEXAPRO) 10 MG tablet      TAKE 1 1/2 TABLETS BY MOUTH EVERY DAY.CURRENTLY WEANING OFF THIS MEDICINE-12/29         . methylPREDNISolone (MEDROL DOSEPAK) 4 MG tablet      follow package directions   21 tablet   0   . zolpidem (AMBIEN) 10 MG tablet      TAKE ONE TABLET BY MOUTH AT BEDTIME AS NEEDED FOR SLEEP   30 tablet   2    BP 161/85  Pulse 101  Temp(Src) 98 F (36.7 C) (Oral)  Resp 16  SpO2 99% Physical Exam  Nursing note and vitals reviewed. Constitutional: He is oriented to person, place, and time. He appears well-developed and well-nourished. No distress.  HENT:  Bilateral TMs are normal Oropharynx with minor smooth erythema without cobblestoning or exudates.  Neck: Normal range of motion. Neck supple.  Cardiovascular: Normal rate, regular rhythm and normal heart sounds.   Pulmonary/Chest: Effort normal and breath sounds normal. No respiratory distress. He has no wheezes. He has no rales.  Musculoskeletal: Normal range of motion. He exhibits no edema.  Lymphadenopathy:    He has cervical adenopathy.  Neurological: He is alert and oriented to person, place, and time.  Skin: Skin is warm and dry. No rash noted.  Psychiatric: He has a normal mood and affect.    ED Course  Procedures (including critical care time) Labs Review Labs Reviewed - No data to display Imaging Review No results found.    MDM   1. Rhinosinusitis      Amoxicillin 1 g twice a day for 7 days Medrol Dosepak as directed Alka-Seltzer cold plus nighttime relief Ibuprofen when necessary, drink plenty of fluids stay well hydrated.   Hayden Rasmussen, NP 10/06/13 1005

## 2013-10-06 NOTE — ED Notes (Signed)
Patient concerned he has sinusitis: patient has had symptoms for 2 weeks and relates onset to raking and mulching leaves.  Yard work followed by feeling bad, cough, sinus drainage, worsening cough, persistent headache, sore teeth, and feeling "worn out".  Sputum has been pale green to dark yellow and green.

## 2013-10-07 NOTE — ED Provider Notes (Signed)
Medical screening examination/treatment/procedure(s) were performed by non-physician practitioner and as supervising physician I was immediately available for consultation/collaboration.  Leslee Home, M.D.  Reuben Likes, MD 10/07/13 [phone]

## 2013-10-18 ENCOUNTER — Emergency Department (HOSPITAL_BASED_OUTPATIENT_CLINIC_OR_DEPARTMENT_OTHER): Payer: BC Managed Care – PPO

## 2013-10-18 ENCOUNTER — Encounter (HOSPITAL_BASED_OUTPATIENT_CLINIC_OR_DEPARTMENT_OTHER): Payer: Self-pay | Admitting: Emergency Medicine

## 2013-10-18 ENCOUNTER — Emergency Department (HOSPITAL_BASED_OUTPATIENT_CLINIC_OR_DEPARTMENT_OTHER)
Admission: EM | Admit: 2013-10-18 | Discharge: 2013-10-18 | Disposition: A | Discharge status: Home / Self Care | Payer: BC Managed Care – PPO | Attending: Emergency Medicine | Admitting: Emergency Medicine

## 2013-10-18 DIAGNOSIS — R05 Cough: Secondary | ICD-10-CM

## 2013-10-18 DIAGNOSIS — R0982 Postnasal drip: Secondary | ICD-10-CM | POA: Insufficient documentation

## 2013-10-18 DIAGNOSIS — G4700 Insomnia, unspecified: Secondary | ICD-10-CM | POA: Insufficient documentation

## 2013-10-18 DIAGNOSIS — R059 Cough, unspecified: Secondary | ICD-10-CM

## 2013-10-18 DIAGNOSIS — Z79899 Other long term (current) drug therapy: Secondary | ICD-10-CM | POA: Insufficient documentation

## 2013-10-18 DIAGNOSIS — J069 Acute upper respiratory infection, unspecified: Secondary | ICD-10-CM | POA: Insufficient documentation

## 2013-10-18 DIAGNOSIS — F411 Generalized anxiety disorder: Secondary | ICD-10-CM | POA: Insufficient documentation

## 2013-10-18 DIAGNOSIS — R000 Tachycardia, unspecified: Secondary | ICD-10-CM | POA: Insufficient documentation

## 2013-10-18 MED ORDER — HYDROCODONE-CHLORPHENIRAMINE 5-4 MG/5ML PO SOLN: 5.0000 mL | Freq: Four times a day (QID) | ORAL | Status: DC | PRN

## 2013-10-18 NOTE — ED Provider Notes (Signed)
CSN: [id number]     Arrival date & time 10/18/13  0801 History   First MD Initiated Contact with Patient 10/18/13 0809     Chief Complaint  Patient presents with  . Cough   (Consider location/radiation/quality/duration/timing/severity/associated sxs/prior Treatment) HPI Comments: 53 yo male with insomnia and allergic rhinitis hx presents with sinus congestion and worsening cough the past 5 days, worse at night. Pt had sinus infection, took amoxicillin and feels the infection now has moved to his chest, mild productive cough at night.  No sick contacts.  No travel.  Feels okay otherwise. No pertussis exposure. Finished amoxicillin.    Patient is a 53 y.o. male presenting with cough. The history is provided by the patient.  Cough Cough characteristics:  Productive Sputum characteristics:  Clear and yellow Severity:  Mild Onset quality:  Gradual Timing:  Intermittent Associated symptoms: no chills, no fever, no headaches and no shortness of breath     Past Medical History  Diagnosis Date  . Anxiety   . Insomnia   . Allergic rhinitis    Past Surgical History  Procedure Laterality Date  . No past surgeries     Family History  Problem Relation Age of Onset  . Colon cancer Neg Hx   . Prostate cancer Neg Hx   . Diabetes Father     borderline  . Heart attack Mother 4065    pass away  . Stroke Neg Hx    History  Substance Use Topics  . Smoking status: Never Smoker   . Smokeless tobacco: Never Used  . Alcohol Use: Yes     Comment: rare    Review of Systems  Constitutional: Negative for fever and chills.  HENT: Positive for congestion.   Respiratory: Positive for cough. Negative for shortness of breath.   Gastrointestinal: Negative for vomiting.  Musculoskeletal: Negative for neck stiffness.  Neurological: Negative for headaches.    Allergies  Aleve  Home Medications   Current Outpatient Rx  Name  Route  Sig  Dispense  Refill  . amoxicillin (AMOXIL) 500 MG capsule    Oral   Take 2 capsules (1,000 mg total) by mouth 2 (two) times daily.   28 capsule   0   . escitalopram (LEXAPRO) 10 MG tablet      TAKE 1 1/2 TABLETS BY MOUTH EVERY DAY.CURRENTLY WEANING OFF THIS MEDICINE-12/29         . methylPREDNISolone (MEDROL DOSEPAK) 4 MG tablet      follow package directions   21 tablet   0   . zolpidem (AMBIEN) 10 MG tablet      TAKE ONE TABLET BY MOUTH AT BEDTIME AS NEEDED FOR SLEEP   30 tablet   2    BP 150/98  Pulse 96  Temp(Src) 98.6 F (37 C) (Oral)  Resp 20  Ht 5\' 11"  (1.803 m)  Wt 187 lb (84.823 kg)  BMI 26.09 kg/m2  SpO2 98% Physical Exam  Nursing note and vitals reviewed. Constitutional: He appears well-developed and well-nourished.  HENT:  Head: Normocephalic and atraumatic.  No trismus, uvular deviation, unilateral posterior pharyngeal edema or submandibular swelling.   Eyes: Conjunctivae are normal. Right eye exhibits no discharge. Left eye exhibits no discharge.  Neck: Normal range of motion. Neck supple. No tracheal deviation present.  Cardiovascular: Regular rhythm.  Tachycardia present.   Pulmonary/Chest: Effort normal and breath sounds normal.  Musculoskeletal: He exhibits no edema.  Lymphadenopathy:    He has no cervical adenopathy.  Neurological: He  is alert.  Skin: Skin is warm. No rash noted.  Psychiatric: He has a normal mood and affect.    ED Course  Procedures (including critical care time) Labs Review Labs Reviewed - No data to display Imaging Review Dg Chest 2 View  10/18/2013   CLINICAL DATA:  Productive cough.  EXAM: CHEST  2 VIEW  COMPARISON:  None.  FINDINGS: The heart size and mediastinal contours are within normal limits. Both lungs are clear. The visualized skeletal structures are unremarkable.  IMPRESSION: No acute cardiopulmonary findings.   Electronically Signed   By: Loralie Champagne M.D.   On: 10/18/2013 08:41    EKG Interpretation   None       MDM   1. Post-nasal drip   2. Cough    3. URI, acute    Well appearing. Clinicaly URI, likely viral.  Pt not a dm, healthy. Discussed sinus drainage as likely causes of cough. CXR to look for occult pneumonia.  Pt does not require abx at this time. Discussed tea/ honey, pt wishes to try cough script. Fup for high bp reading discussed.   Results and differential diagnosis were discussed with the patient. Close follow up outpatient was discussed, patient comfortable with the plan.   Diagnosis:above     Enid Skeens, MD 10/18/13 [phone]

## 2013-10-18 NOTE — Discharge Instructions (Signed)
Try natural honey with tea. If you were given medicines take as directed.  If you are on coumadin or contraceptives realize their levels and effectiveness is altered by many different medicines.  If you have any reaction (rash, tongues swelling, other) to the medicines stop taking and see a physician.   Please follow up as directed and return to the ER or see a physician for new or worsening symptoms.  Thank you. Blood pressure was high in ED, have it rechecked by your physician.

## 2013-10-18 NOTE — ED Notes (Signed)
Pt states he has been sick since before christmas.  Productive cough, pale yellow green sputum.  Now having sore throat.  Pt treated on the 29th but not improving.

## 2013-12-12 ENCOUNTER — Encounter: Payer: BC Managed Care – PPO | Admitting: Internal Medicine

## 2013-12-20 ENCOUNTER — Other Ambulatory Visit: Payer: Self-pay | Admitting: Internal Medicine

## 2013-12-22 ENCOUNTER — Telehealth: Payer: Self-pay | Admitting: *Deleted

## 2013-12-22 MED ORDER — ZOLPIDEM TARTRATE 10 MG PO TABS: ORAL_TABLET | ORAL | Status: DC

## 2013-12-22 MED ORDER — ESCITALOPRAM OXALATE 10 MG PO TABS: ORAL_TABLET | ORAL | Status: DC

## 2013-12-22 NOTE — Telephone Encounter (Signed)
rx refill- Ambien 10 mg  Last OV- 12/10/12 Last refilled- 09/22/13 #30 / 2 rf.

## 2013-12-22 NOTE — Addendum Note (Signed)
Addended by: Willow OraPAZ, Karan Ramnauth E on: 12/22/2013 04:50 PM   Modules accepted: Orders

## 2013-12-22 NOTE — Telephone Encounter (Signed)
Printed prescription for 30 and one refill. Advise patient he is due for a physical, please arrange at his convenience

## 2013-12-22 NOTE — Addendum Note (Signed)
Addended by: Eustace QuailEABOLD, Toshika Parrow J on: 12/22/2013 05:02 PM   Modules accepted: Orders

## 2013-12-23 NOTE — Telephone Encounter (Signed)
rx faxed to [facility] . Pt notified and scheduled for CPE.

## 2014-01-22 ENCOUNTER — Telehealth: Payer: Self-pay

## 2014-01-22 NOTE — Telephone Encounter (Signed)
Medication List and allergies:  Reviewed and updated  90 day supply/mail order: na Local prescriptions: [facility] and [address]  Immunizations due: UTD  A/P:   No changes to FH, PSH or Personal Hx Flu vaccine--08/2013 Tdap--07/2005  CCS--never had one PSA--12/2012--0.74  To Discuss with Provider: [facility]

## 2014-01-23 ENCOUNTER — Encounter: Payer: Self-pay | Admitting: Internal Medicine

## 2014-01-23 ENCOUNTER — Ambulatory Visit (INDEPENDENT_AMBULATORY_CARE_PROVIDER_SITE_OTHER): Payer: BC Managed Care – PPO | Admitting: Internal Medicine

## 2014-01-23 VITALS — BP 127/86 | HR 67 | Temp 97.9°F | Ht 72.0 in | Wt 189.0 lb

## 2014-01-23 DIAGNOSIS — F411 Generalized anxiety disorder: Secondary | ICD-10-CM

## 2014-01-23 DIAGNOSIS — Z0000 Encounter for general adult medical examination without abnormal findings: Secondary | ICD-10-CM

## 2014-01-23 LAB — CBC WITH DIFFERENTIAL/PLATELET
Basophils Absolute: 0 10*3/uL (ref 0.0–0.1)
Basophils Relative: 0.4 % (ref 0.0–3.0)
EOS ABS: 0.2 10*3/uL (ref 0.0–0.7)
EOS PCT: 2.5 % (ref 0.0–5.0)
HCT: 48 % (ref 39.0–52.0)
HEMOGLOBIN: 16.6 g/dL (ref 13.0–17.0)
LYMPHS PCT: 20.4 % (ref 12.0–46.0)
Lymphs Abs: 1.9 10*3/uL (ref 0.7–4.0)
MCHC: 34.5 g/dL (ref 30.0–36.0)
MCV: 92.9 fl (ref 78.0–100.0)
MONOS PCT: 5.7 % (ref 3.0–12.0)
Monocytes Absolute: 0.5 10*3/uL (ref 0.1–1.0)
NEUTROS ABS: 6.5 10*3/uL (ref 1.4–7.7)
Neutrophils Relative %: 71 % (ref 43.0–77.0)
Platelets: 338 10*3/uL (ref 150.0–400.0)
RBC: 5.17 Mil/uL (ref 4.22–5.81)
RDW: 13.4 % (ref 11.5–14.6)
WBC: 9.2 10*3/uL (ref 4.5–10.5)

## 2014-01-23 LAB — LIPID PANEL
Cholesterol: 213 mg/dL — ABNORMAL HIGH (ref 0–200)
HDL: 34.7 mg/dL — AB (ref >39.00)
LDL CALC: 143 mg/dL — AB (ref 0–99)
TRIGLYCERIDES: 177 mg/dL — AB (ref 0.0–149.0)
Total CHOL/HDL Ratio: 6
VLDL: 35.4 mg/dL (ref 0.0–40.0)

## 2014-01-23 LAB — COMPREHENSIVE METABOLIC PANEL
ALT: 20 U/L (ref 0–53)
AST: 24 U/L (ref 0–37)
Albumin: 4.5 g/dL (ref 3.5–5.2)
Alkaline Phosphatase: 72 U/L (ref 39–117)
BUN: 16 mg/dL (ref 6–23)
CALCIUM: 9.4 mg/dL (ref 8.4–10.5)
CHLORIDE: 103 meq/L (ref 96–112)
CO2: 27 mEq/L (ref 19–32)
CREATININE: 1.2 mg/dL (ref 0.4–1.5)
GFR: 68.05 mL/min (ref >60.00)
Glucose, Bld: 66 mg/dL — ABNORMAL LOW (ref 70–99)
Potassium: 3.8 mEq/L (ref 3.5–5.1)
Sodium: 140 mEq/L (ref 135–145)
Total Bilirubin: 0.7 mg/dL (ref 0.3–1.2)
Total Protein: 7.5 g/dL (ref 6.0–8.3)

## 2014-01-23 LAB — PSA: PSA: 0.7 ng/mL (ref 0.10–4.00)

## 2014-01-23 LAB — TSH: TSH: 1.39 u[IU]/mL (ref 0.35–5.50)

## 2014-01-23 MED ORDER — ALPRAZOLAM 0.25 MG PO TABS: 0.2500 mg | ORAL_TABLET | Freq: Two times a day (BID) | ORAL | Status: DC | PRN

## 2014-01-23 NOTE — Assessment & Plan Note (Addendum)
Self discontinue Lexapro several months ago, feeling great except days or hours prior to a presentation at work, he gets really worried and anxious, would like to take something as needed. We agreed on a low dose of Xanax, see prescriptions and instructions. Patient to try Xanax days  before a  presentation to be sure he doesn't get excessively sleepy UDS today

## 2014-01-23 NOTE — Progress Notes (Signed)
Pre visit review using our clinic review tool, if applicable. No additional management support is needed unless otherwise documented below in the visit note. 

## 2014-01-23 NOTE — Assessment & Plan Note (Addendum)
Td 2006 cscope-- never , discussed  Cologuard, Cscope vs iFOB; provided IFOB, will think about other options  Labs  Diet very good, exercise discussed    Checking his BP twice in the last few weeks one time was 149/90, then 139/89, admits to some stress. Recommend continue self-monitoring, see instructions

## 2014-01-23 NOTE — Progress Notes (Signed)
   Subjective:    Patient ID: Miguel Brooks, male    DOB: 1960/10/29, 53 y.o.   MRN: [id number]  DOS:  01/23/2014 Type of  visit: CPX We also discussed anxiety and elevated BP    ROS Diet--  healthy Exercise-- active  No  CP, SOB Denies  nausea, vomiting diarrhea denies  blood in the stools (-) cough, sputum production (-) wheezing, chest congestion  No dysuria, gross hematuria, difficulty urinating   No  depression    Past Medical History  Diagnosis Date  . Anxiety   . Insomnia   . Allergic rhinitis     Past Surgical History  Procedure Laterality Date  . No past surgeries      History   Social History  . Marital Status: Single    Spouse Name: N/A    Number of Children: 2  . Years of Education: N/A   Occupational History  . sales    .     Social History Main Topics  . Smoking status: Never Smoker   . Smokeless tobacco: Never Used  . Alcohol Use: Yes     Comment: rare  . Drug Use: No  . Sexual Activity: Not on file   Other Topics Concern  . Not on file   Social History Narrative   Lives w/ wife of 15 years, children grown    2 children (from wife), 3 Gkids, 1 on the way                    Family History  Problem Relation Age of Onset  . Colon cancer Neg Hx   . Prostate cancer Neg Hx   . Diabetes Father     borderline  . Heart attack Mother 3465    pass away  . Stroke Neg Hx        Medication List       This list is accurate as of: 01/23/14 11:59 PM.  Always use your most recent med list.               ALPRAZolam 0.25 MG tablet  Commonly known as:  XANAX  Take 1 tablet (0.25 mg total) by mouth 2 (two) times daily as needed for anxiety.     zolpidem 10 MG tablet  Commonly known as:  AMBIEN  TAKE ONE TABLET BY MOUTH AT BEDTIME AS NEEDED FOR SLEEP           Objective:   Physical Exam BP 127/86  Pulse 67  Temp(Src) 97.9 F (36.6 C)  Ht 6' (1.829 m)  Wt 189 lb (85.73 kg)  BMI 25.63 kg/m2  SpO2 100% General --  alert, well-developed, NAD.  Neck --no thyromegaly , normal carotid pulse  HEENT-- Not pale.  Lungs -- normal respiratory effort, no intercostal retractions, no accessory muscle use, and normal breath sounds.  Heart-- normal rate, regular rhythm, no murmur.  Abdomen-- Not distended, good bowel sounds,soft, non-tender. Rectal-- No external abnormalities noted. Normal sphincter tone. No rectal masses or tenderness. Branscomb stool, Hemoccult negative  Prostate--Prostate gland firm and smooth, no enlargement, nodularity, tenderness, mass, asymmetry or induration. Extremities-- no pretibial edema bilaterally  Neurologic--  alert & oriented X3. Speech normal, gait normal, strength normal in all extremities.  Psych-- Cognition and judgment appear intact. Cooperative with normal attention span and concentration. No anxious or depressed appearing.        Assessment & Plan:

## 2014-01-23 NOTE — Patient Instructions (Signed)
Get your blood work before you leave  Need a UDS  Take alprazolam   as needed for anxiety. You can try 0.5 , 1 or 1.5 tablets twice a day. Watch for excessive somnolence  Return the IFOB, think about COLOGUARD or a colonoscopy  Check the  blood pressure 2 or 3 times a month   be sure it is between 110/60 and 140/85. Ideal blood pressure is 120/80. If it is consistently higher or lower, let me know   Next visit is for routine check up   in 6 months  No need to come back fasting Please make an appointment

## 2014-01-29 ENCOUNTER — Telehealth: Payer: Self-pay | Admitting: Internal Medicine

## 2014-01-29 DIAGNOSIS — G4700 Insomnia, unspecified: Secondary | ICD-10-CM

## 2014-01-29 NOTE — Telephone Encounter (Signed)
Patient called stating someone told him that his alprazolam was ready at the pharmacy. He drove to the pharmacy and it was not there. Patient states he is very frustrated that he always has to hold for so long before someone answers the phone and he is upset that he drove into town for no reason. He is requesting that someone call him back today and let him know that the rx was sent to the pharmacy and that the pharmacy received it. CB# [phone]

## 2014-01-29 NOTE — Telephone Encounter (Signed)
Patient called and stated that his Ambien was suppose to be refilled at his last office visit. Please advise.  [phone] - [place], [place] - [address] [facility] [address]

## 2014-01-30 MED ORDER — ZOLPIDEM TARTRATE 10 MG PO TABS: ORAL_TABLET | ORAL | Status: DC

## 2014-01-30 NOTE — Telephone Encounter (Signed)
Ok ambien #30, 4 RF

## 2014-01-30 NOTE — Telephone Encounter (Addendum)
Rx for [facility] Us[facility]ien printed and placed on ledge for signature.

## 2014-01-30 NOTE — Telephone Encounter (Signed)
Called in alprazolam  to pharmacy. Apparently never received fax that was sent.   ambien 10 mg  Last OV- 01/23/14 Last refilled- 12/22/13 #30 / 1 rf  UDS- 07/15/13 LOW risk

## 2014-01-30 NOTE — Telephone Encounter (Signed)
rx called in to pts pharmacy.

## 2014-01-30 NOTE — Addendum Note (Signed)
Addended by: Baldwin JamaicaJOHNSON, Gadge Hermiz G on: 01/30/2014 12:53 PM   Modules accepted: Orders

## 2014-01-30 NOTE — Telephone Encounter (Signed)
Lmovm. Notifying pt of rx sent.

## 2014-02-20 ENCOUNTER — Other Ambulatory Visit (INDEPENDENT_AMBULATORY_CARE_PROVIDER_SITE_OTHER): Payer: BC Managed Care – PPO

## 2014-02-20 DIAGNOSIS — Z0000 Encounter for general adult medical examination without abnormal findings: Secondary | ICD-10-CM

## 2014-02-20 LAB — FECAL OCCULT BLOOD, IMMUNOCHEMICAL: Fecal Occult Bld: NEGATIVE

## 2014-02-27 ENCOUNTER — Encounter: Payer: Self-pay | Admitting: Internal Medicine

## 2014-04-03 IMAGING — CR DG CHEST 2V
2 series · 2 of 2 positions shown · non-contrast
Comparison: None.

CLINICAL DATA: Productive cough.

EXAM:
CHEST  2 VIEW

[w chest pa]
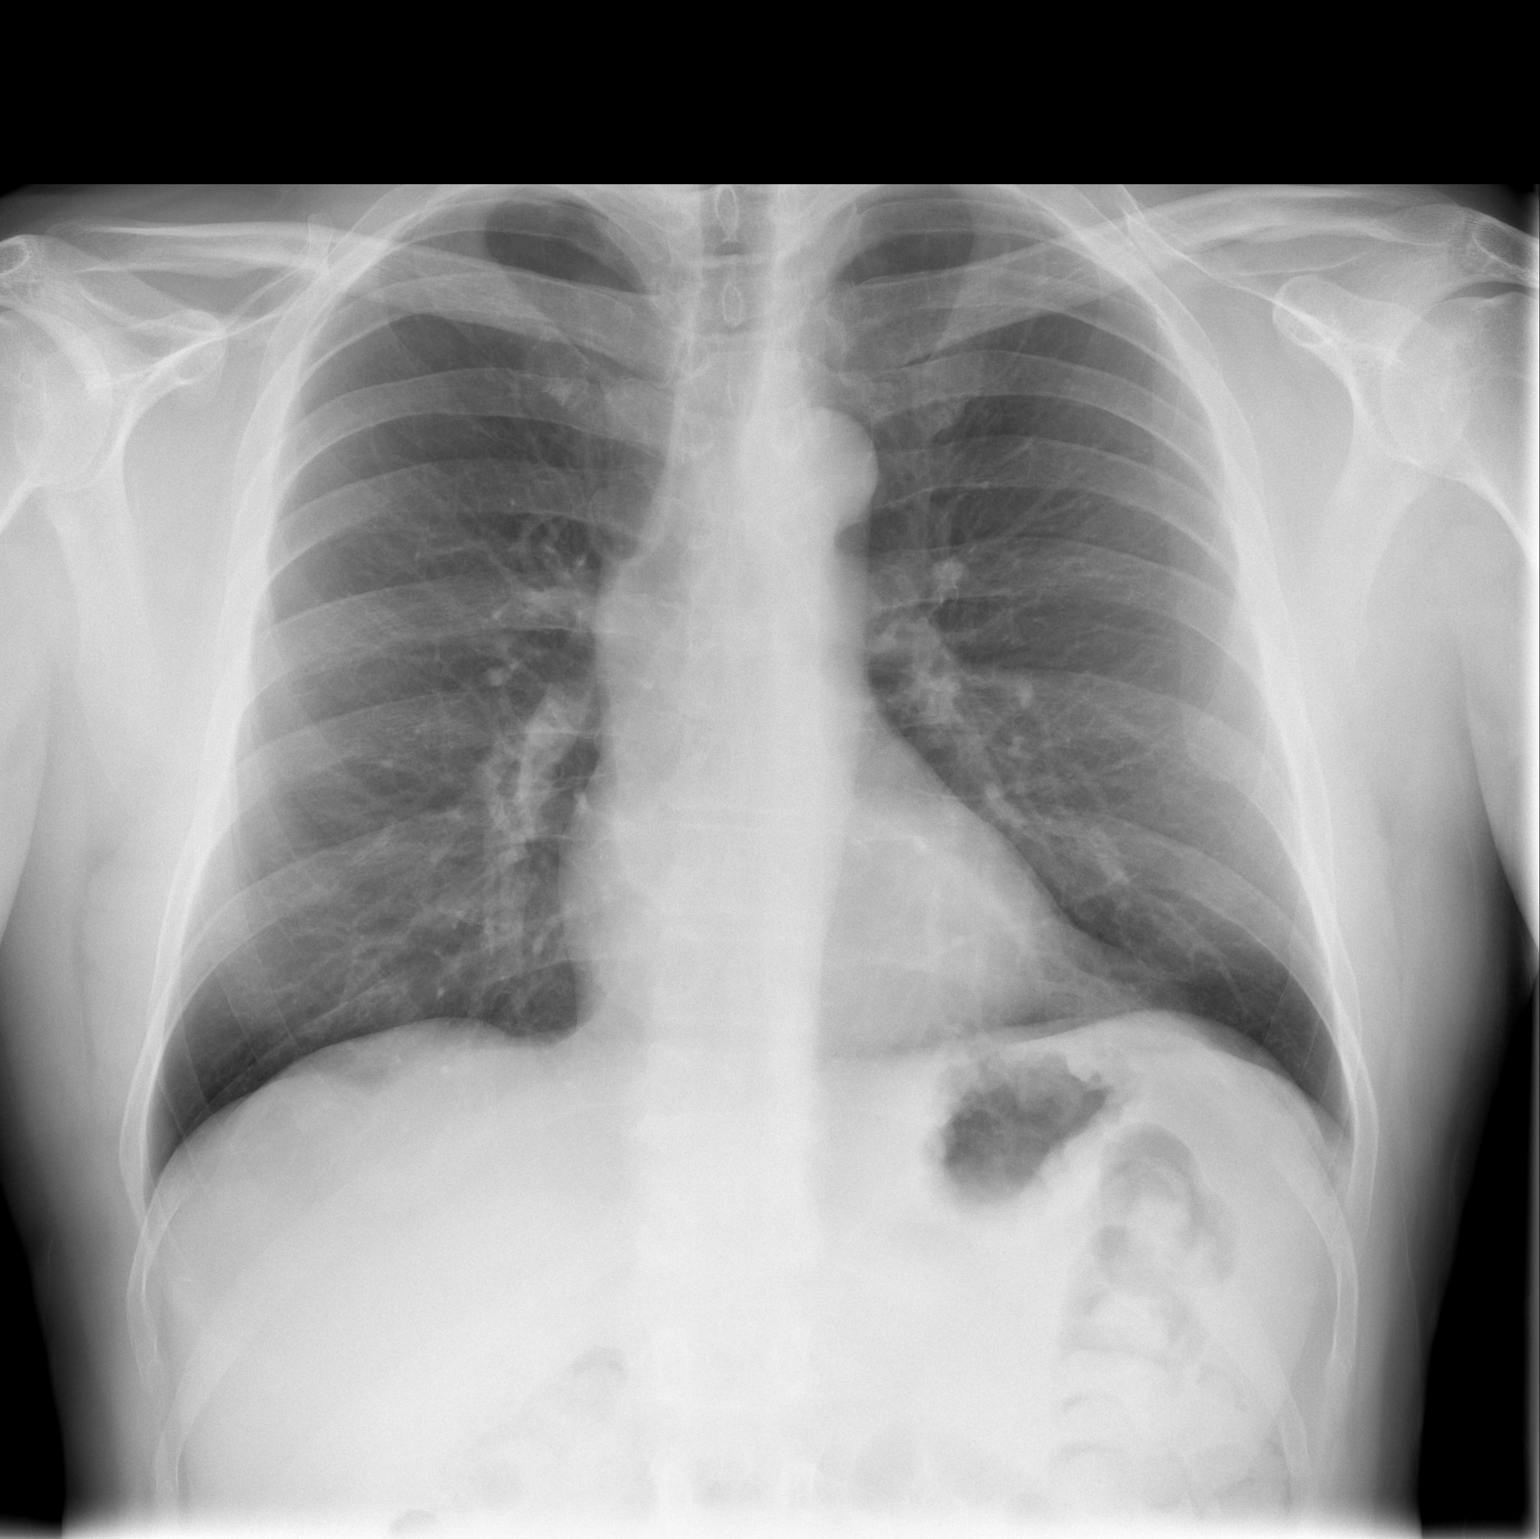

[w chest lat]
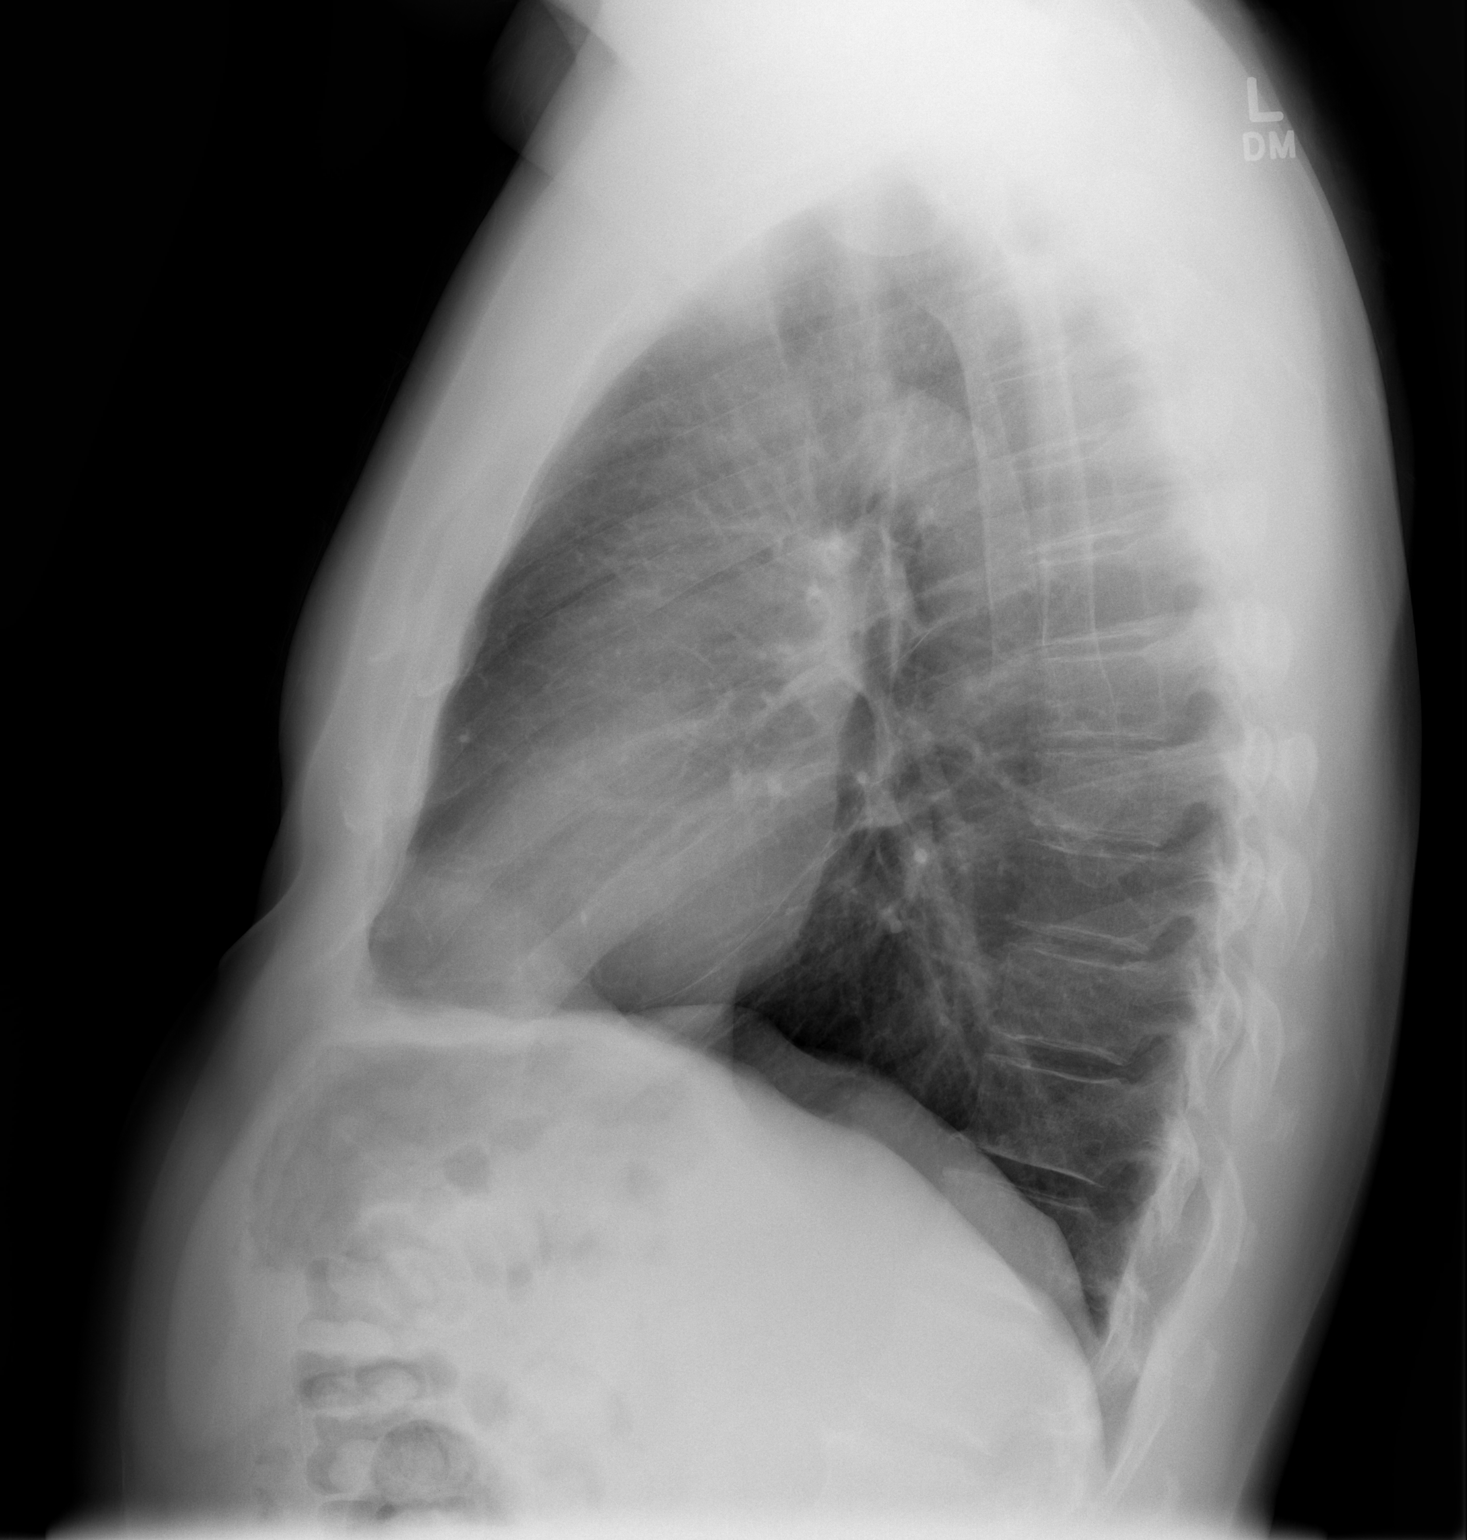

[2 of 2 positions shown; findings below may reference images not displayed]

FINDINGS: The heart size and mediastinal contours are within normal limits.
Both lungs are clear. The visualized skeletal structures are
unremarkable.
IMPRESSION: No acute cardiopulmonary findings.

## 2014-07-22 ENCOUNTER — Telehealth: Payer: Self-pay

## 2014-07-22 ENCOUNTER — Other Ambulatory Visit: Payer: Self-pay | Admitting: Internal Medicine

## 2014-07-22 DIAGNOSIS — G4700 Insomnia, unspecified: Secondary | ICD-10-CM

## 2014-07-22 MED ORDER — ZOLPIDEM TARTRATE 10 MG PO TABS: ORAL_TABLET | ORAL | Status: DC

## 2014-07-22 NOTE — Telephone Encounter (Signed)
Faxed to [facility]

## 2014-07-22 NOTE — Telephone Encounter (Signed)
Pt is requesting refill on Zolpidem.   Last OV: 01/23/2014 Upcoming appt 08/21/2014 Last Refill: 01/30/2014 # 30 4 RF UDS: None  Please advise.

## 2014-07-22 NOTE — Telephone Encounter (Signed)
done

## 2014-07-31 ENCOUNTER — Ambulatory Visit: Payer: BC Managed Care – PPO | Admitting: Internal Medicine

## 2014-08-21 ENCOUNTER — Encounter: Payer: Self-pay | Admitting: Internal Medicine

## 2014-08-21 ENCOUNTER — Ambulatory Visit (INDEPENDENT_AMBULATORY_CARE_PROVIDER_SITE_OTHER): Payer: BC Managed Care – PPO | Admitting: Internal Medicine

## 2014-08-21 VITALS — BP 162/98 | HR 78 | Temp 98.1°F | Wt 190.5 lb

## 2014-08-21 DIAGNOSIS — R030 Elevated blood-pressure reading, without diagnosis of hypertension: Secondary | ICD-10-CM

## 2014-08-21 DIAGNOSIS — G8929 Other chronic pain: Secondary | ICD-10-CM

## 2014-08-21 DIAGNOSIS — F419 Anxiety disorder, unspecified: Secondary | ICD-10-CM

## 2014-08-21 DIAGNOSIS — IMO0001 Reserved for inherently not codable concepts without codable children: Secondary | ICD-10-CM

## 2014-08-21 DIAGNOSIS — R51 Headache: Secondary | ICD-10-CM

## 2014-08-21 NOTE — Patient Instructions (Addendum)
Check the  blood pressure 2 or 3 times a  Week   Be sure your blood pressure is between  145/85  and 110/65.   Call in 2-3  weeks and let me know how your blood pressure was doing  Low salt diet  Next visit by April 2016 for a physical    Low-Sodium Eating Plan Sodium raises blood pressure and causes water to be held in the body. Getting less sodium from food will help lower your blood pressure, reduce any swelling, and protect your heart, liver, and kidneys. We get sodium by adding salt (sodium chloride) to food. Most of our sodium comes from canned, boxed, and frozen foods. Restaurant foods, fast foods, and pizza are also very high in sodium. Even if you take medicine to lower your blood pressure or to reduce fluid in your body, getting less sodium from your food is important. WHAT IS MY PLAN? Most people should limit their sodium intake to 2,300 mg a day. Your health care provider recommends that you limit your sodium intake to __________ a day.  WHAT DO I NEED TO KNOW ABOUT THIS EATING PLAN? For the low-sodium eating plan, you will follow these general guidelines:  Choose foods with a % Daily Value for sodium of less than 5% (as listed on the food label).   Use salt-free seasonings or herbs instead of table salt or sea salt.   Check with your health care provider or pharmacist before using salt substitutes.   Eat fresh foods.  Eat more vegetables and fruits.  Limit canned vegetables. If you do use them, rinse them well to decrease the sodium.   Limit cheese to 1 oz (28 g) per day.   Eat lower-sodium products, often labeled as "lower sodium" or "no salt added."  Avoid foods that contain monosodium glutamate (MSG). MSG is sometimes added to [place] food and some canned foods.  Check food labels (Nutrition Facts labels) on foods to learn how much sodium is in one serving.  Eat more home-cooked food and less restaurant, buffet, and fast food.  When eating at a  restaurant, ask that your food be prepared with less salt or none, if possible.  HOW DO I READ FOOD LABELS FOR SODIUM INFORMATION? The Nutrition Facts label lists the amount of sodium in one serving of the food. If you eat more than one serving, you must multiply the listed amount of sodium by the number of servings. Food labels may also identify foods as:  Sodium free--Less than 5 mg in a serving.  Very low sodium--35 mg or less in a serving.  Low sodium--140 mg or less in a serving.  Light in sodium--50% less sodium in a serving. For example, if a food that usually has 300 mg of sodium is changed to become light in sodium, it will have 150 mg of sodium.  Reduced sodium--25% less sodium in a serving. For example, if a food that usually has 400 mg of sodium is changed to reduced sodium, it will have 300 mg of sodium. WHAT FOODS CAN I EAT? Grains Low-sodium cereals, including oats, puffed wheat and rice, and shredded wheat cereals. Low-sodium crackers. Unsalted rice and pasta. Lower-sodium bread.  Vegetables Frozen or fresh vegetables. Low-sodium or reduced-sodium canned vegetables. Low-sodium or reduced-sodium tomato sauce and paste. Low-sodium or reduced-sodium tomato and vegetable juices.  Fruits Fresh, frozen, and canned fruit. Fruit juice.  Meat and Other Protein Products Low-sodium canned tuna and salmon. Fresh or frozen meat, poultry, seafood,  and fish. Lamb. Unsalted nuts. Dried beans, peas, and lentils without added salt. Unsalted canned beans. Homemade soups without salt. Eggs.  Dairy Milk. Soy milk. Ricotta cheese. Low-sodium or reduced-sodium cheeses. Yogurt.  Condiments Fresh and dried herbs and spices. Salt-free seasonings. Onion and garlic powders. Low-sodium varieties of mustard and ketchup. Lemon juice.  Fats and Oils Reduced-sodium salad dressings. Unsalted butter.  Other Unsalted popcorn and pretzels.  The items listed above may not be a complete list of  recommended foods or beverages. Contact your dietitian for more options. WHAT FOODS ARE NOT RECOMMENDED? Grains Instant hot cereals. Bread stuffing, pancake, and biscuit mixes. Croutons. Seasoned rice or pasta mixes. Noodle soup cups. Boxed or frozen macaroni and cheese. Self-rising flour. Regular salted crackers. Vegetables Regular canned vegetables. Regular canned tomato sauce and paste. Regular tomato and vegetable juices. Frozen vegetables in sauces. Salted french fries. Olives. Rosita FirePickles. Relishes. Sauerkraut. Salsa. Meat and Other Protein Products Salted, canned, smoked, spiced, or pickled meats, seafood, or fish. Bacon, ham, sausage, hot dogs, corned beef, chipped beef, and packaged luncheon meats. Salt pork. Jerky. Pickled herring. Anchovies, regular canned tuna, and sardines. Salted nuts. Dairy Processed cheese and cheese spreads. Cheese curds. Blue cheese and cottage cheese. Buttermilk.  Condiments Onion and garlic salt, seasoned salt, table salt, and sea salt. Canned and packaged gravies. Worcestershire sauce. Tartar sauce. Barbecue sauce. Teriyaki sauce. Soy sauce, including reduced sodium. Steak sauce. Fish sauce. Oyster sauce. Cocktail sauce. Horseradish. Regular ketchup and mustard. Meat flavorings and tenderizers. Bouillon cubes. Hot sauce. Tabasco sauce. Marinades. Taco seasonings. Relishes. Fats and Oils Regular salad dressings. Salted butter. Margarine. Ghee. Bacon fat.  Other Potato and tortilla chips. Corn chips and puffs. Salted popcorn and pretzels. Canned or dried soups. Pizza. Frozen entrees and pot pies.  The items listed above may not be a complete list of foods and beverages to avoid. Contact your dietitian for more information. Document Released: 03/17/2002 Document Revised: 09/30/2013 Document Reviewed: 07/30/2013 [facility] Patient Information 2015 [place] [employer] [place]. This information is not intended to replace advice given to you by your health care provider. Make  sure you discuss any questions you have with your health care provider.

## 2014-08-21 NOTE — Progress Notes (Signed)
Pre visit review using our clinic review tool, if applicable. No additional management support is needed unless otherwise documented below in the visit note. 

## 2014-08-21 NOTE — Progress Notes (Signed)
   Subjective:    Patient ID: Miguel Brooks, male    DOB: 12-13-1960, 53 y.o.   MRN: [id number]  DOS:  08/21/2014 Type of visit - description : f/u Interval history: Insomnia, well-controlled with Ambien Anxiety, very rarely takes Xanax BP was noted to be elevated, recheck twice. No ambulatory BPs, no history of hypertension   ROS Admits to on and off headaches, sometimes they don't respond well to OTCs. No nausea, vomiting,  depression.  Headaches are not new to him, he felt they were sinus related.  Past Medical History  Diagnosis Date  . Anxiety   . Insomnia   . Allergic rhinitis     Past Surgical History  Procedure Laterality Date  . No past surgeries      History   Social History  . Marital Status: Single    Spouse Name: N/A    Number of Children: 2  . Years of Education: N/A   Occupational History  . sales    .     Social History Main Topics  . Smoking status: Never Smoker   . Smokeless tobacco: Never Used  . Alcohol Use: Yes     Comment: rare  . Drug Use: No  . Sexual Activity: Not on file   Other Topics Concern  . Not on file   Social History Narrative   Lives w/ wife of 15 years, children grown    2 children (from wife), 3 Gkids, 1 on the way                       Medication List       This list is accurate as of: 08/21/14 11:59 PM.  Always use your most recent med list.               ALPRAZolam 0.25 MG tablet  Commonly known as:  XANAX  Take 1 tablet (0.25 mg total) by mouth 2 (two) times daily as needed for anxiety.     zolpidem 10 MG tablet  Commonly known as:  AMBIEN  TAKE ONE TABLET BY MOUTH AT BEDTIME AS NEEDED FOR SLEEP           Objective:   Physical Exam BP 162/98 mmHg  Pulse 78  Temp(Src) 98.1 F (36.7 C) (Oral)  Wt 190 lb 8 oz (86.41 kg)  SpO2 99% General -- alert, well-developed, NAD.   Lungs -- normal respiratory effort, no intercostal retractions, no accessory muscle use, and normal breath sounds.     Extremities-- no pretibial edema bilaterally  Neurologic--  alert & oriented X3. Speech normal, gait appropriate for age, strength symmetric and appropriate for age.  Psych-- Cognition and judgment appear intact. Cooperative with normal attention span and concentration. No anxious or depressed appearing.      Assessment & Plan:     Today , I spent more than 25   min with the patient: >50% of the time counseling regards the new problem of elevated BP, low salt diet, need to monitor BPs

## 2014-08-22 DIAGNOSIS — R51 Headache: Secondary | ICD-10-CM

## 2014-08-22 DIAGNOSIS — R519 Headache, unspecified: Secondary | ICD-10-CM | POA: Insufficient documentation

## 2014-08-22 DIAGNOSIS — I10 Essential (primary) hypertension: Secondary | ICD-10-CM | POA: Insufficient documentation

## 2014-08-22 NOTE — Assessment & Plan Note (Signed)
Elevated BP, BP was elevated when my nurse check, I recheck in both arms and got about the same numbers 6/90. Recommend a low-salt diet, exercise, monitor BPs and call in 2 weeks. Consider medication.

## 2014-08-22 NOTE — Assessment & Plan Note (Signed)
Headache, Long history of headaches, used to see a specialist Dr Thera FlakeAlderman , at some point was prescribed Ultram. Few months ago went to see a ENT physician, no sinus problems were determined. The headache is right around the frontal sinuses and maxillary sinuses. Plan: Trial with OTC Nasacort

## 2014-08-22 NOTE — Assessment & Plan Note (Signed)
Anxiety, insomnia Well-controlled with Ambien, hardly ever uses Xanax

## 2014-08-25 ENCOUNTER — Encounter (HOSPITAL_COMMUNITY): Payer: BC Managed Care – PPO

## 2014-09-16 ENCOUNTER — Telehealth: Payer: Self-pay | Admitting: Internal Medicine

## 2014-09-16 NOTE — Telephone Encounter (Signed)
LMOM for Pt to return call.  

## 2014-09-16 NOTE — Telephone Encounter (Signed)
Please call the patient, how are his ambulatory BPs?

## 2014-09-21 NOTE — Telephone Encounter (Signed)
I have been unable to contact Pt, letter printed and mailed to Pt informing him to contact office at earliest convenience.

## 2014-09-21 NOTE — Telephone Encounter (Signed)
thx

## 2014-09-25 ENCOUNTER — Encounter: Payer: Self-pay | Admitting: Internal Medicine

## 2014-09-25 ENCOUNTER — Other Ambulatory Visit: Payer: Self-pay

## 2014-09-25 ENCOUNTER — Telehealth: Payer: Self-pay

## 2014-09-25 MED ORDER — AMLODIPINE BESYLATE 5 MG PO TABS: 5.0000 mg | ORAL_TABLET | Freq: Every day | ORAL | Status: DC

## 2014-09-25 NOTE — Telephone Encounter (Signed)
-----   Message from Wanda PlumpJose E Paz, MD sent at 09/25/2014  5:17 PM EST ----- Regarding: Amlodipine Call in amlodipine 5 mg 1 by mouth daily #30 and 6 refills

## 2014-09-25 NOTE — Telephone Encounter (Signed)
Aml[facility]ipine 5 mg 1 tablet [facility] as instructed. # 30 and 6 refills.

## 2014-10-10 ENCOUNTER — Telehealth: Payer: Self-pay | Admitting: Internal Medicine

## 2014-10-10 NOTE — Telephone Encounter (Signed)
I sent a message ~09-25-14 an apparently pt has not open, please see message below and discuss w/  Pt  --- Tood, your blood pressures continued to be elevated, in addition to trying to eat a low-salt diet and increase your exercise I think a medication is needed .  We'll call amlodipine 5 mg, take 1 tablet daily. This is a very good medication, the only common side effects is leg swelling. If that happens, please let me know.  After you start the medication the BP should gradually decrease, call with your BP readings in one month. THX  JP

## 2014-10-12 NOTE — Telephone Encounter (Signed)
LMOM for Pt to return call.  

## 2014-10-13 NOTE — Telephone Encounter (Signed)
Unable to get in contact with Pt, letter printed and mailed to him, informing him of Dr. Drue NovelPaz recommendations below. Informed him [facility]

## 2015-02-12 ENCOUNTER — Other Ambulatory Visit: Payer: Self-pay | Admitting: Internal Medicine

## 2015-02-12 NOTE — Telephone Encounter (Signed)
Faxed to [facility] pharmacy.  

## 2015-02-12 NOTE — Telephone Encounter (Signed)
Pt is requesting refill on Ambien.  Last OV: 08/21/2014 Last Fill: 07/22/2014 #30 4RF  Please advise.

## 2015-02-12 NOTE — Telephone Encounter (Signed)
Rx printed, awaiting MD signature.  

## 2015-02-12 NOTE — Telephone Encounter (Signed)
Ok 30 and 1 RF 

## 2015-02-23 ENCOUNTER — Telehealth: Payer: Self-pay | Admitting: Internal Medicine

## 2015-02-23 DIAGNOSIS — Z1211 Encounter for screening for malignant neoplasm of colon: Secondary | ICD-10-CM

## 2015-02-23 NOTE — Telephone Encounter (Signed)
Referral placed to GI, Dr. Russella DarStark.

## 2015-02-23 NOTE — Telephone Encounter (Signed)
Notified pt referral has been placed.

## 2015-02-23 NOTE — Telephone Encounter (Signed)
Caller: Miguel Brooks Ph#: [phone]  Pt would like referral sent for Colonoscopy. He states it needs completed by 04/08/15 per insurance. He would like us to refer him to Miguel [facility] at [facility] if possible. Please notify once referral sent.

## 2015-02-25 ENCOUNTER — Encounter: Payer: Self-pay | Admitting: Gastroenterology

## 2015-04-07 ENCOUNTER — Ambulatory Visit (AMBULATORY_SURGERY_CENTER): Payer: Self-pay | Admitting: *Deleted

## 2015-04-07 VITALS — Ht 72.0 in | Wt 189.6 lb

## 2015-04-07 DIAGNOSIS — Z1211 Encounter for screening for malignant neoplasm of colon: Secondary | ICD-10-CM

## 2015-04-07 MED ORDER — NA SULFATE-K SULFATE-MG SULF 17.5-3.13-1.6 GM/177ML PO SOLN: 1.0000 | Freq: Once | ORAL | Status: DC

## 2015-04-07 NOTE — Progress Notes (Signed)
No past sedation or surgeries No egg or soy allergy No diet pills No home 02  emmi declined

## 2015-04-08 ENCOUNTER — Encounter: Payer: Self-pay | Admitting: Gastroenterology

## 2015-04-23 ENCOUNTER — Ambulatory Visit (AMBULATORY_SURGERY_CENTER): Payer: BLUE CROSS/BLUE SHIELD | Admitting: Gastroenterology

## 2015-04-23 ENCOUNTER — Encounter: Payer: Self-pay | Admitting: Gastroenterology

## 2015-04-23 VITALS — BP 134/82 | HR 69 | Temp 96.4°F | Resp 21 | Ht 72.0 in | Wt 189.0 lb

## 2015-04-23 DIAGNOSIS — Z1211 Encounter for screening for malignant neoplasm of colon: Secondary | ICD-10-CM | POA: Diagnosis not present

## 2015-04-23 MED ORDER — SODIUM CHLORIDE 0.9 % IV SOLN: 500.0000 mL | INTRAVENOUS | Status: DC

## 2015-04-23 NOTE — Op Note (Signed)
[facility] [address] [place], [id number]   COLONOSCOPY PROCEDURE REPORT  PATIENT: Miguel Brooks, Miguel Brooks  MR#: [id number] BIRTHDATE: [birth year] , 53  yrs. old GENDER: male ENDOSCOPIST: Meryl DareMalcolm Brooks Tamera Pingley, MD, [facility] REFERRED XB:JYNWBY:Jose Drue NovelPaz, M.D. PROCEDURE DATE:  04/23/2015 PROCEDURE:   Colonoscopy, screening First Screening Colonoscopy - Avg.  risk and is 50 yrs.  old or older Yes.  Prior Negative Screening - Now for repeat screening. N/A  History of Adenoma - Now for follow-up colonoscopy & has been > or = to 3 yrs.  N/A  Polyps removed today? No Recommend repeat exam, <10 yrs? No ASA CLASS:   Class II INDICATIONS:Screening for colonic neoplasia and Colorectal Neoplasm Risk Assessment for this procedure is average risk. MEDICATIONS: Monitored anesthesia care and Propofol 200 mg IV DESCRIPTION OF PROCEDURE:   After the risks benefits and alternatives of the procedure were thoroughly explained, informed consent was obtained.  The digital rectal exam revealed no abnormalities of the rectum.   The LB [id number]-[id number]  endoscope was introduced through the anus and advanced to the cecum, which was identified by both the appendix and ileocecal valve. No adverse events experienced.   The quality of the prep was excellent. (Suprep was used)  The instrument was then slowly withdrawn as the colon was fully examined. Estimated blood loss is zero unless otherwise noted in this procedure report.    COLON FINDINGS: There was mild diverticulosis noted in the sigmoid colon.   The examination was otherwise normal.  Retroflexed views revealed no abnormalities. The time to cecum = 1.6 Withdrawal time = 12.9   The scope was withdrawn and the procedure completed. COMPLICATIONS: There were no immediate complications.  ENDOSCOPIC IMPRESSION: 1.   Mild diverticulosis in the sigmoid colon 2.   The examination was otherwise normal  RECOMMENDATIONS: 1.  High fiber diet with liberal fluid  intake. 2.  Continue current colorectal screening recommendations for "routine risk" patients with a repeat colonoscopy in 10 years.  eSigned:  Meryl DareMalcolm Brooks Monik Lins, MD, [facility] 04/23/2015 1:44 PM

## 2015-04-23 NOTE — Progress Notes (Signed)
Report to PACU, RN, vss, BBS= Clear.  

## 2015-04-23 NOTE — Patient Instructions (Signed)
YOU HAD AN ENDOSCOPIC PROCEDURE TODAY AT THE [facility]:   Refer to the procedure report that was given to you for any specific questions about what was found during the examination.  If the procedure report does not answer your questions, please call your gastroenterologist to clarify.  If you requested that your care partner not be given the details of your procedure findings, then the procedure report has been included in a sealed envelope for you to review at your convenience later.  YOU SHOULD EXPECT: Some feelings of bloating in the abdomen. Passage of more gas than usual.  Walking can help get rid of the air that was put into your GI tract during the procedure and reduce the bloating. If you had a lower endoscopy (such as a colonoscopy or flexible sigmoidoscopy) you may notice spotting of blood in your stool or on the toilet paper. If you underwent a bowel prep for your procedure, you may not have a normal bowel movement for a few days.  Please Note:  You might notice some irritation and congestion in your nose or some drainage.  This is from the oxygen used during your procedure.  There is no need for concern and it should clear up in a day or so.  SYMPTOMS TO REPORT IMMEDIATELY:   Following lower endoscopy (colonoscopy or flexible sigmoidoscopy):  Excessive amounts of blood in the stool  Significant tenderness or worsening of abdominal pains  Swelling of the abdomen that is new, acute  Fever of 100F or higher   For urgent or emergent issues, a gastroenterologist can be reached at any hour by calling [phone].   DIET: Your first meal following the procedure should be a small meal and then it is ok to progress to your normal diet. Heavy or fried foods are harder to digest and may make you feel nauseous or bloated.  Likewise, meals heavy in dairy and vegetables can increase bloating.  Drink plenty of fluids but you should avoid alcoholic beverages for 24  hours.  ACTIVITY:  You should plan to take it easy for the rest of today and you should NOT DRIVE or use heavy machinery until tomorrow (because of the sedation medicines used during the test).    FOLLOW UP: Our staff will call the number listed on your records the next business day following your procedure to check on you and address any questions or concerns that you may have regarding the information given to you following your procedure. If we do not reach you, we will leave a message.  However, if you are feeling well and you are not experiencing any problems, there is no need to return our call.  We will assume that you have returned to your regular daily activities without incident.  If any biopsies were taken you will be contacted by phone or by letter within the next 1-3 weeks.  Please call us at [phone] if you have not heard about the biopsies in 3 weeks.    SIGNATURES/CONFIDENTIALITY: You and/or your care partner have signed paperwork which will be entered into your electronic medical record.  These signatures attest to the fact that that the information above on your After Visit Summary has been reviewed and is understood.  Full responsibility of the confidentiality of this discharge information lies with you and/or your care-partner.  Diverticulosis and high fiber diet information given. 

## 2015-04-25 ENCOUNTER — Other Ambulatory Visit: Payer: Self-pay | Admitting: Internal Medicine

## 2015-04-26 ENCOUNTER — Telehealth: Payer: Self-pay

## 2015-04-26 NOTE — Telephone Encounter (Signed)
  Follow up Call-  Call back number 04/23/2015  Post procedure Call Back phone  # [phone]  Permission to leave phone message Yes     Patient questions:  Do you have a fever, pain , or abdominal swelling? No. Pain Score  0 *  Have you tolerated food without any problems? Yes.    Have you been able to return to your normal activities? Yes.    Do you have any questions about your discharge instructions: Diet   No. Medications  No. Follow up visit  No.  Do you have questions or concerns about your Care? No.  Actions: * If pain score is 4 or above: No action needed, pain <4.  No problems per the pt.  He wanted to thank everyone for the professional, excellent care he received.  maw

## 2015-05-03 ENCOUNTER — Other Ambulatory Visit: Payer: Self-pay | Admitting: Internal Medicine

## 2015-05-03 NOTE — Telephone Encounter (Signed)
Pt is requesting refill on Ambien.  Last OV: 08/21/2014  Last Fill: 02/12/2015 #30 1RF UDS: None  Please advise

## 2015-05-03 NOTE — Telephone Encounter (Signed)
Rx printed, awaiting MD signature.  

## 2015-05-03 NOTE — Telephone Encounter (Signed)
Rx faxed to [facility].  

## 2015-05-03 NOTE — Telephone Encounter (Signed)
Ok for #30, no refills 

## 2015-05-31 ENCOUNTER — Encounter: Payer: Self-pay | Admitting: *Deleted

## 2015-05-31 ENCOUNTER — Telehealth: Payer: Self-pay | Admitting: *Deleted

## 2015-05-31 NOTE — Telephone Encounter (Signed)
Pre-Visit Call completed with patient and chart updated.   Pre-Visit Info documented in Specialty Comments under SnapShot.    

## 2015-06-01 ENCOUNTER — Ambulatory Visit (INDEPENDENT_AMBULATORY_CARE_PROVIDER_SITE_OTHER): Payer: BLUE CROSS/BLUE SHIELD | Admitting: Internal Medicine

## 2015-06-01 ENCOUNTER — Encounter: Payer: Self-pay | Admitting: Internal Medicine

## 2015-06-01 VITALS — BP 128/72 | HR 70 | Temp 98.0°F | Ht 72.0 in | Wt 182.4 lb

## 2015-06-01 DIAGNOSIS — R030 Elevated blood-pressure reading, without diagnosis of hypertension: Secondary | ICD-10-CM

## 2015-06-01 DIAGNOSIS — F419 Anxiety disorder, unspecified: Secondary | ICD-10-CM

## 2015-06-01 DIAGNOSIS — Z1159 Encounter for screening for other viral diseases: Secondary | ICD-10-CM

## 2015-06-01 DIAGNOSIS — Z23 Encounter for immunization: Secondary | ICD-10-CM

## 2015-06-01 DIAGNOSIS — IMO0001 Reserved for inherently not codable concepts without codable children: Secondary | ICD-10-CM

## 2015-06-01 DIAGNOSIS — Z0000 Encounter for general adult medical examination without abnormal findings: Secondary | ICD-10-CM | POA: Diagnosis not present

## 2015-06-01 DIAGNOSIS — Z114 Encounter for screening for human immunodeficiency virus [HIV]: Secondary | ICD-10-CM

## 2015-06-01 LAB — BASIC METABOLIC PANEL
BUN: 15 mg/dL (ref 6–23)
CALCIUM: 9.6 mg/dL (ref 8.4–10.5)
CO2: 28 mEq/L (ref 19–32)
CREATININE: 1.08 mg/dL (ref 0.40–1.50)
Chloride: 104 mEq/L (ref 96–112)
GFR: 75.72 mL/min (ref >60.00)
GLUCOSE: 103 mg/dL — AB (ref 70–99)
Potassium: 4.2 mEq/L (ref 3.5–5.1)
Sodium: 139 mEq/L (ref 135–145)

## 2015-06-01 LAB — LIPID PANEL
Cholesterol: 207 mg/dL — ABNORMAL HIGH (ref 0–200)
HDL: 35.4 mg/dL — AB (ref >39.00)
LDL Cholesterol: 134 mg/dL — ABNORMAL HIGH (ref 0–99)
NONHDL: 171.37
Total CHOL/HDL Ratio: 6
Triglycerides: 185 mg/dL — ABNORMAL HIGH (ref 0.0–149.0)
VLDL: 37 mg/dL (ref 0.0–40.0)

## 2015-06-01 MED ORDER — ALPRAZOLAM 0.25 MG PO TABS: 0.2500 mg | ORAL_TABLET | Freq: Two times a day (BID) | ORAL | Status: DC | PRN

## 2015-06-01 MED ORDER — ZOLPIDEM TARTRATE 10 MG PO TABS: 10.0000 mg | ORAL_TABLET | Freq: Every evening | ORAL | Status: DC | PRN

## 2015-06-01 MED ORDER — AMLODIPINE BESYLATE 5 MG PO TABS: 5.0000 mg | ORAL_TABLET | Freq: Every day | ORAL | Status: DC

## 2015-06-01 NOTE — Progress Notes (Signed)
Subjective:    Patient ID: Miguel Brooks, male    DOB: August 15, 1961, 54 y.o.   MRN: [id number]  DOS:  06/01/2015 Type of visit - description : CPX Interval history: Good compliance of medication, BP 1 check is usually very good    Review of Systems  Constitutional: No fever. No chills. . No unusual sweats. Recently moved to a smaller house, was stressed out d/t the move, has lost some weight. Some fatigue, not exercising as much as before.   Wt Readings from Last 3 Encounters:  06/01/15 182 lb 6 oz (82.725 kg)  04/23/15 189 lb (85.73 kg)  04/07/15 189 lb 9.6 oz (86.002 kg)    HEENT: No dental problems, no ear discharge, no facial swelling, no voice changes. No eye discharge, no eye  redness , no  intolerance to light   Respiratory: No wheezing , no  difficulty breathing. No cough , no mucus production  Cardiovascular: No CP, no leg swelling , no  Palpitations  GI: no nausea, no vomiting, no diarrhea , no  abdominal pain.  No blood in the stools. No dysphagia, no odynophagia    Endocrine: No polyphagia, no polyuria , no polydipsia  GU: No dysuria, gross hematuria, difficulty urinating. No urinary urgency, no frequency. Occasional nocturia, 2-3 times a night for years  Musculoskeletal: No joint swellings or unusual aches or pains  Skin: No change in the color of the skin, palor , no  Rash  Allergic, immunologic: No environmental allergies , no  food allergies  Neurological: No dizziness no  syncope. No headaches. No diplopia, no slurred, no slurred speech, no motor deficits, no facial  Numbness  Hematological: No enlarged lymph nodes, no easy bruising , no unusual bleedings  Psychiatry: No suicidal ideas, no hallucinations, no beavior problems, no confusion.  Was more stressed out lately, sleeps okay.    Past Medical History  Diagnosis Date  . Anxiety   . Insomnia   . Allergic rhinitis   . Allergy   . Hypertension     Past Surgical History  Procedure  Laterality Date  . No past surgeries      Social History   Social History  . Marital Status: Single    Spouse Name: N/A  . Number of Children: 2  . Years of Education: N/A   Occupational History  . sales    .     Social History Main Topics  . Smoking status: Never Smoker   . Smokeless tobacco: Never Used  . Alcohol Use: 0.0 oz/week    0 Standard drinks or equivalent per week     Comment: rare  . Drug Use: No  . Sexual Activity: Not on file   Other Topics Concern  . Not on file   Social History Narrative   Lives w/ wife of 16 years, children grown , household is pt and wife   2 children (from wife), 4 Gkids                  Family History  Problem Relation Age of Onset  . Colon cancer Neg Hx   . Prostate cancer Neg Hx   . Stroke Neg Hx   . Diabetes Father     borderline  . Cancer - Other Father     mixofibrosarcoma  . Heart attack Mother 7    pass away       Medication List       This list is accurate as of:  06/01/15  1:14 PM.  Always use your most recent med list.               ALPRAZolam 0.25 MG tablet  Commonly known as:  XANAX  Take 1 tablet (0.25 mg total) by mouth 2 (two) times daily as needed for anxiety.     amLODipine 5 MG tablet  Commonly known as:  NORVASC  Take 1 tablet (5 mg total) by mouth daily.     zolpidem 10 MG tablet  Commonly known as:  AMBIEN  Take 1 tablet (10 mg total) by mouth at bedtime as needed for sleep.           Objective:   Physical Exam BP 128/72 mmHg  Pulse 70  Temp(Src) 98 F (36.7 C) (Oral)  Ht 6' (1.829 m)  Wt 182 lb 6 oz (82.725 kg)  BMI 24.73 kg/m2  SpO2 99% General:   Well developed, well nourished . NAD.  HEENT:  Normocephalic . Face symmetric, atraumatic Neck: No thyromegaly Lungs:  CTA B Normal respiratory effort, no intercostal retractions, no accessory muscle use. Heart: RRR,  no murmur.  no pretibial edema bilaterally  Abdomen:  Not distended, soft, non-tender. No rebound or  rigidity. No mass,organomegaly Skin: Not pale. Not jaundice Neurologic:  alert & oriented X3.  Speech normal, gait appropriate for age and unassisted Psych--  Cognition and judgment appear intact.  Cooperative with normal attention span and concentration.  Behavior appropriate. No anxious or depressed appearing.    Assessment & Plan:  BMP FLP HIV and hep C

## 2015-06-01 NOTE — Patient Instructions (Addendum)
Get your blood work before you leave , will also need a urine sample    Check the  blood pressure   Monthly  Be sure your blood pressure is between 110/65 and  145/85.  if it is consistently higher or lower, let me know   If you need more information about a healthy diet, visit  the American Heart Association, it  is a great resource online at:  [employer]

## 2015-06-01 NOTE — Progress Notes (Signed)
Pre visit review using our clinic review tool, if applicable. No additional management support is needed unless otherwise documented below in the visit note. 

## 2015-06-01 NOTE — Assessment & Plan Note (Addendum)
Td 05-2015 CCS: 04/23/15 with Claudette Head, MD- diverticulosis, otherwise normal f/u 10 years  Prostate cancer screening: Previous PSAs normal, last DRE/2015 normal. No family history. Reassess next year Labs  Diet  discussed , likes to eat healthier, information provided. Exercise: Recommend to gradually go back to a more routine exercise.

## 2015-06-01 NOTE — Assessment & Plan Note (Signed)
Reports very good BP semiambulatory settings, continue amlodipine

## 2015-06-01 NOTE — Assessment & Plan Note (Addendum)
Symptoms well-controlled with alprazolam which he takes it sporadically and zolpidem usually half tablet daily

## 2015-06-02 LAB — HIV ANTIBODY (ROUTINE TESTING W REFLEX): HIV: NONREACTIVE

## 2015-06-02 LAB — HEPATITIS C ANTIBODY: HCV AB: NEGATIVE

## 2015-06-16 ENCOUNTER — Other Ambulatory Visit: Payer: Self-pay | Admitting: Family Medicine

## 2015-12-16 ENCOUNTER — Other Ambulatory Visit: Payer: Self-pay | Admitting: Internal Medicine

## 2015-12-16 NOTE — Telephone Encounter (Signed)
Ok 30 and 5 RF 

## 2015-12-16 NOTE — Telephone Encounter (Signed)
Rx printed, awaiting MD signature.  

## 2015-12-16 NOTE — Telephone Encounter (Signed)
Pt is requesting refill on Ambien.  Last OV: 06/01/2015 Last Fill: 06/01/2015 #30 and 4RF  Please advise.

## 2015-12-16 NOTE — Telephone Encounter (Signed)
Rx faxed to [facility].  

## 2016-01-14 ENCOUNTER — Ambulatory Visit (INDEPENDENT_AMBULATORY_CARE_PROVIDER_SITE_OTHER): Payer: BLUE CROSS/BLUE SHIELD | Admitting: Podiatry

## 2016-01-14 ENCOUNTER — Encounter: Payer: Self-pay | Admitting: Podiatry

## 2016-01-14 ENCOUNTER — Ambulatory Visit (INDEPENDENT_AMBULATORY_CARE_PROVIDER_SITE_OTHER): Payer: BLUE CROSS/BLUE SHIELD

## 2016-01-14 VITALS — BP 135/97 | HR 80 | Resp 12

## 2016-01-14 DIAGNOSIS — M79673 Pain in unspecified foot: Secondary | ICD-10-CM

## 2016-01-14 DIAGNOSIS — M779 Enthesopathy, unspecified: Secondary | ICD-10-CM | POA: Diagnosis not present

## 2016-01-14 DIAGNOSIS — M205X1 Other deformities of toe(s) (acquired), right foot: Secondary | ICD-10-CM | POA: Diagnosis not present

## 2016-01-14 NOTE — Progress Notes (Signed)
   Subjective:    Patient ID: Miguel Brooks, male    DOB: 1961/07/04, [age] y.o.   MRN: [id number]  HPI  PT STATED B/L GREAT TOENAIL HAVE DISCOLORATION/SORE FOR 6 WEEKS. TOENAILS GET WORSE WHEN WALKING 2 MILES WITH TENNIS SHOES. TRIED SOAK WITH EPSOM SALT.  ALSO, B/L FEET GET SORE/SWOLLEN.  Review of Systems  Musculoskeletal: Positive for joint swelling.  Skin: Positive for color change.       Objective:   Physical Exam        Assessment & Plan:

## 2016-01-16 NOTE — Progress Notes (Signed)
Subjective:     Patient ID: Miguel Brooks, male   DOB: Jan 28, 1961, 55 y.o.   MRN: [id number]  Foot Pain   patient states I've had some discoloration in my big toenails bilateral for 6 weeks. They get worse when I do a lot of walking and I've tried to soak them. Patient also has mild discomfort in the feet in general and especially when he does a lot of ambulation   Review of Systems  All other systems reviewed and are negative.      Objective:   Physical Exam  Constitutional: He is oriented to person, place, and time.  Cardiovascular: Intact distal pulses.   Musculoskeletal: Normal range of motion.  Neurological: He is oriented to person, place, and time.  Skin: Skin is warm.  Nursing note and vitals reviewed.  neurovascular status is found to be intact muscle strength is adequate range of motion within normal limits. Patient is found to have discomfort in the first MPJ of a mild nature right with some enlargement of the bone structure over left. Hallux nails are discolored bilateral but they are not loose and there is no drainage noted     Assessment:     Nail disease which is most likely related to compensatory type problems or related to tight shoe gear along with hallux limitus of a functional nature right over left    Plan:     H&P and x-rays reviewed with patient. Today I went ahead and I scanned for custom orthotics to provide for better stability around the first metatarsal right and also to take pressure off the left first MPJ  X-ray report indicated there is some enlargement around the head of the first metatarsal right with small spur formation and mild narrowness of the joint

## 2016-01-24 ENCOUNTER — Ambulatory Visit: Payer: BLUE CROSS/BLUE SHIELD | Admitting: Podiatry

## 2016-02-08 ENCOUNTER — Ambulatory Visit (INDEPENDENT_AMBULATORY_CARE_PROVIDER_SITE_OTHER): Payer: BLUE CROSS/BLUE SHIELD | Admitting: *Deleted

## 2016-02-08 DIAGNOSIS — M779 Enthesopathy, unspecified: Secondary | ICD-10-CM

## 2016-02-08 NOTE — Progress Notes (Signed)
Patient ID: Miguel Brooks, male   DOB: Oct 27, 1960, 55 y.o.   MRN: [id number] Patient presents for orthotic pick up.  Verbal and written break in and wear instructions given.  Patient will follow up in 4 weeks if symptoms worsen or fail to improve.

## 2016-02-08 NOTE — Patient Instructions (Signed)

## 2016-02-17 ENCOUNTER — Telehealth: Payer: Self-pay | Admitting: *Deleted

## 2016-02-17 MED ORDER — TRAMADOL HCL 50 MG PO TABS: 50.0000 mg | ORAL_TABLET | Freq: Three times a day (TID) | ORAL | Status: DC | PRN

## 2016-02-17 NOTE — Telephone Encounter (Addendum)
Pt states he received his orthotics a few weeks ago, but still continue to have pain in the toes.  Pt states returned from international trade show, was on his feet most of the time and the great toes joints became very painful.  Pt states he was in the orthotics the whole time and he did change shoe, the orthotics did help, but he is still having a lot of pain and needs to work. Pt states Tylenol and Ibuprofen are not helping.  Pt states he had previously had Ultram for another problem and it had helped.  I told pt I would inform Dr. Charlsie Merlesegal of his request and call again.  I instructed pt to wear his orthotics as long as they were comfortable then take them out and not put them back in again until the next day, and to do this until he could wear them either the whole day or his whole work day, this was a slower way to get use to the orthotics.  I told him he should set up an appt with Dr. Charlsie Merlesegal for 2-3 weeks from now and bring the orthotics for follow up appt, an encouraged him to call with concerns or if he needed an earlier appt. Dr. Charlsie Merlesegal ordered tramadol 50mg  #30 one tablet every 8 hours prn. I informed pt and called to the [facility] in [place].

## 2016-03-10 ENCOUNTER — Ambulatory Visit (INDEPENDENT_AMBULATORY_CARE_PROVIDER_SITE_OTHER): Payer: BLUE CROSS/BLUE SHIELD | Admitting: Podiatry

## 2016-03-10 ENCOUNTER — Encounter: Payer: Self-pay | Admitting: Podiatry

## 2016-03-10 VITALS — BP 144/66 | HR 78 | Resp 12

## 2016-03-10 DIAGNOSIS — M779 Enthesopathy, unspecified: Secondary | ICD-10-CM | POA: Diagnosis not present

## 2016-03-10 DIAGNOSIS — M205X1 Other deformities of toe(s) (acquired), right foot: Secondary | ICD-10-CM

## 2016-03-13 NOTE — Progress Notes (Signed)
Subjective:     Patient ID: Miguel Brooks, male   DOB: Aug 25, 1961, 55 y.o.   MRN: [id number]  HPI patient states that overall he is doing very well but that he's having pain in his left big toe joint if she's on it too long   Review of Systems     Objective:   Physical Exam Neurovascular status intact muscle strength adequate with discomfort around the MPJ left of a minimal nature but worse if the patient wear certain types shoes without orthotics    Assessment:     Functional hallux limitus condition    Plan:     Reviewed physical therapy for this anti-inflammatories and utilization of orthotics. Patient will be seen back and may require second pair

## 2016-04-27 DIAGNOSIS — H524 Presbyopia: Secondary | ICD-10-CM | POA: Diagnosis not present

## 2016-04-27 DIAGNOSIS — H5213 Myopia, bilateral: Secondary | ICD-10-CM | POA: Diagnosis not present

## 2016-06-01 ENCOUNTER — Encounter: Payer: Self-pay | Admitting: Internal Medicine

## 2016-06-01 ENCOUNTER — Ambulatory Visit (INDEPENDENT_AMBULATORY_CARE_PROVIDER_SITE_OTHER): Payer: BLUE CROSS/BLUE SHIELD | Admitting: Internal Medicine

## 2016-06-01 VITALS — BP 126/78 | HR 81 | Temp 98.1°F | Resp 12 | Ht 72.0 in | Wt 191.5 lb

## 2016-06-01 DIAGNOSIS — Z0000 Encounter for general adult medical examination without abnormal findings: Secondary | ICD-10-CM

## 2016-06-01 DIAGNOSIS — Z09 Encounter for follow-up examination after completed treatment for conditions other than malignant neoplasm: Secondary | ICD-10-CM | POA: Insufficient documentation

## 2016-06-01 LAB — CBC WITH DIFFERENTIAL/PLATELET
BASOS ABS: 0 10*3/uL (ref 0.0–0.1)
Basophils Relative: 0.5 % (ref 0.0–3.0)
Eosinophils Absolute: 0.2 10*3/uL (ref 0.0–0.7)
Eosinophils Relative: 2.4 % (ref 0.0–5.0)
HCT: 43.7 % (ref 39.0–52.0)
Hemoglobin: 15.3 g/dL (ref 13.0–17.0)
LYMPHS ABS: 1.6 10*3/uL (ref 0.7–4.0)
Lymphocytes Relative: 18.9 % (ref 12.0–46.0)
MCHC: 34.9 g/dL (ref 30.0–36.0)
MCV: 91.5 fl (ref 78.0–100.0)
MONO ABS: 0.5 10*3/uL (ref 0.1–1.0)
Monocytes Relative: 6.1 % (ref 3.0–12.0)
NEUTROS PCT: 72.1 % (ref 43.0–77.0)
Neutro Abs: 6.2 10*3/uL (ref 1.4–7.7)
Platelets: 316 10*3/uL (ref 150.0–400.0)
RBC: 4.78 Mil/uL (ref 4.22–5.81)
RDW: 13.1 % (ref 11.5–15.5)
WBC: 8.6 10*3/uL (ref 4.0–10.5)

## 2016-06-01 LAB — LIPID PANEL
CHOL/HDL RATIO: 5
Cholesterol: 190 mg/dL (ref 0–200)
HDL: 36 mg/dL — AB (ref >39.00)
LDL Cholesterol: 125 mg/dL — ABNORMAL HIGH (ref 0–99)
NONHDL: 153.68
Triglycerides: 142 mg/dL (ref 0.0–149.0)
VLDL: 28.4 mg/dL (ref 0.0–40.0)

## 2016-06-01 LAB — URINALYSIS, ROUTINE W REFLEX MICROSCOPIC
Bilirubin Urine: NEGATIVE
Ketones, ur: NEGATIVE
LEUKOCYTES UA: NEGATIVE
Nitrite: NEGATIVE
PH: 6.5 (ref 5.0–8.0)
Specific Gravity, Urine: <=1.005 — AB (ref 1.000–1.030)
TOTAL PROTEIN, URINE-UPE24: NEGATIVE
URINE GLUCOSE: NEGATIVE
UROBILINOGEN UA: 0.2 (ref 0.0–1.0)

## 2016-06-01 LAB — ALT: ALT: 19 U/L (ref 0–53)

## 2016-06-01 LAB — BASIC METABOLIC PANEL
BUN: 16 mg/dL (ref 6–23)
CHLORIDE: 104 meq/L (ref 96–112)
CO2: 27 meq/L (ref 19–32)
CREATININE: 1.14 mg/dL (ref 0.40–1.50)
Calcium: 8.9 mg/dL (ref 8.4–10.5)
GFR: 70.88 mL/min (ref >60.00)
Glucose, Bld: 107 mg/dL — ABNORMAL HIGH (ref 70–99)
Potassium: 4.2 mEq/L (ref 3.5–5.1)
Sodium: 139 mEq/L (ref 135–145)

## 2016-06-01 LAB — PSA: PSA: 1.08 ng/mL (ref 0.10–4.00)

## 2016-06-01 LAB — AST: AST: 14 U/L (ref 0–37)

## 2016-06-01 NOTE — Assessment & Plan Note (Addendum)
Td 05-2015 [facility]: 04/23/15 with Claudette HeadMalcolm Stark, MD- diverticulosis; f/u 10 years  Prostate cancer screening: DRE normal today, checking PSA. Has some nocturia: Avoid late fluid drinking,  decrease caffeine intake, check a UA  +FH CAD, aspirin? Allergic to Aleve. Labs  Diet , Exercise:  Trying to do better

## 2016-06-01 NOTE — Progress Notes (Signed)
Pre visit review using our clinic review tool, if applicable. No additional management support is needed unless otherwise documented below in the visit note. 

## 2016-06-01 NOTE — Patient Instructions (Addendum)
GO TO THE LAB : Get the blood work     GO TO THE FRONT DESK Schedule your next appointment for a  physical exam in one year    Check the  blood pressure monthly  Be sure your blood pressure is between 110/65 and  145/85. If it is consistently higher or lower, let me know  Continue working with Dr. Charlsie Merlesegal, consider see  a orthopedic MD , Dr Lajoyce Cornersuda

## 2016-06-01 NOTE — Progress Notes (Signed)
Subjective:    Patient ID: Miguel Brooks, male    DOB: March 10, 1961, 55 y.o.   MRN: [id number]  DOS:  06/01/2016 Type of visit - description : cpx Interval history:  No major concerns.  Review of Systems  Constitutional: No fever. No chills. No unexplained wt changes. No unusual sweats  HEENT: No dental problems, no ear discharge, no facial swelling, no voice changes. No eye discharge, no eye  redness , no  intolerance to light   Respiratory: No wheezing , no  difficulty breathing. No cough , no mucus production  Cardiovascular: No CP, no leg swelling , no  Palpitations  GI: no nausea, no vomiting, no diarrhea , no  abdominal pain.  No blood in the stools. No dysphagia, no odynophagia    Endocrine: No polyphagia, no polyuria , no polydipsia  GU: No dysuria, gross hematuria, difficulty urinating. No urinary urgency, no frequency. Nocturia 2 mosts nights without symptoms  Musculoskeletal:  B great toe pain and occasional swelling, usually after walking, went to see podiatry, rx shoe inserts, better but still has occ pain.  Skin: No change in the color of the skin, palor , no  Rash  Allergic, immunologic: No environmental allergies , no  food allergies  Neurological: No dizziness no  syncope. No headaches. No diplopia, no slurred, no slurred speech, no motor deficits, no facial  Numbness  Hematological: No enlarged lymph nodes, no easy bruising , no unusual bleedings  Psychiatry: No suicidal ideas, no hallucinations, no beavior problems, no confusion.  No unusual/severe anxiety, no depression  Past Medical History:  Diagnosis Date  . Allergic rhinitis   . Anxiety   . Hypertension   . Insomnia     Past Surgical History:  Procedure Laterality Date  . NO PAST SURGERIES      Social History   Social History  . Marital status: Married    Spouse name: N/A  . Number of children: 2  . 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 Smoking status: Never Smoker  . Smokeless tobacco: Never Used  . Alcohol use 0.0 oz/week     Comment: rare  . Drug use: No  . Sexual activity: Not on file   Other Topics Concern  . Not on file   Social History Narrative   Lives w/ wife, children grown , household is pt and wife   2 children (from wife), 4 Gkids                  Family History  Problem Relation Age of Onset  . Diabetes Father     borderline  . Cancer - Other Father     mixofibrosarcoma  . Heart attack Mother [id number]    pass away  . Colon cancer Neg Hx   . Prostate cancer Neg Hx   . Stroke Neg Hx        Medication List       Accurate as of 06/01/16  1:45 PM. Always use your most recent med list.          ALPRAZolam 0.25 MG tablet Commonly known as:  XANAX Take 1 tablet (0.25 mg total) by mouth 2 (two) times daily as needed for anxiety.   amLODipine 5 MG tablet Commonly known as:  NORVASC Take 1 tablet (5 mg total) by mouth daily.   traMADol 50 MG tablet Commonly known as:  Janean SarkULTRAM  Take 1 tablet (50 mg total) by mouth every 8 (eight) hours as needed.   zolpidem 10 MG tablet Commonly known as:  AMBIEN Take 1 tablet (10 mg total) by mouth at bedtime as needed for sleep.          Objective:   Physical Exam BP 126/78 (BP Location: Left Arm, Patient Position: Sitting, Cuff Size: Normal)   Pulse 81   Temp 98.1 F (36.7 C) (Oral)   Resp 12   Ht 6' (1.829 m)   Wt 191 lb 8 oz (86.9 kg)   SpO2 97%   BMI 25.97 kg/m   General:   Well developed, well nourished . NAD.  Neck: No  thyromegaly  HEENT:  Normocephalic . Face symmetric, atraumatic Lungs:  CTA B Normal respiratory effort, no intercostal retractions, no accessory muscle use. Heart: RRR,  no murmur.  No pretibial edema bilaterally  Abdomen:  Not distended, soft, non-tender. No rebound or rigidity.   Rectal:  External abnormalities: none. Normal sphincter tone. No rectal masses or tenderness.  Stool Sobocinski    Prostate: Prostate gland firm and smooth, no enlargement, nodularity, tenderness, mass, asymmetry or induration.   MSK: feet normal to inspection Skin: Exposed areas without rash. Not pale. Not jaundice Neurologic:  alert & oriented X3.  Speech normal, gait appropriate for age and unassisted Strength symmetric and appropriate for age.  Psych: Cognition and judgment appear intact.  Cooperative with normal attention span and concentration.  Behavior appropriate. No anxious or depressed appearing.    Assessment & Plan:   Assessment HTN Anxiety, insomnia Headaches, used to see Dr. Thera FlakeAlderman Allergic rhinitis Sees dermatology  PLAN: HTN: On amlodipine, no s/e, BP seems well-controlled, recommend to check from time to time Anxiety insomnia: takes 1/2 [place]  most days, rarely uses Xanax Headaches: Rarely uses tramadol Toe pain bilaterally: Recommend to continue working with podiatry, see the orthopedic doctor if needed. RTC one year.

## 2016-06-01 NOTE — Assessment & Plan Note (Signed)
HTN: On amlodipine, no s/e, BP seems well-controlled, recommend to check from time to time Anxiety insomnia: takes 1/2 [place]  most days, rarely uses Xanax Headaches: Rarely uses tramadol Toe pain bilaterally: Recommend to continue working with podiatry, see the orthopedic doctor if needed. RTC one year.

## 2016-06-04 ENCOUNTER — Other Ambulatory Visit: Payer: Self-pay | Admitting: Internal Medicine

## 2016-06-14 ENCOUNTER — Encounter: Payer: Self-pay | Admitting: Internal Medicine

## 2016-06-14 MED ORDER — CETIRIZINE-PSEUDOEPHEDRINE ER 5-120 MG PO TB12: 1.0000 | ORAL_TABLET | Freq: Two times a day (BID) | ORAL | 0 refills | Status: DC

## 2016-06-14 NOTE — Telephone Encounter (Signed)
Please advise in PCP absence.  

## 2016-06-14 NOTE — Telephone Encounter (Signed)
Dr. Drue NovelPaz.   Pt of your who has flex spending account wanted zyrtec D rx. He has been on this for 2 years. He has htn. I advised generally try to avoid d component with htn history sicne bp can increase. He is on amlodipine and checks bp maybe 3 times a week(he states bp well controlled). I did give him 60 tabs. Wanted to make aware if you did not know he takes this and wanted to make you aware that I gave him a script.

## 2016-07-10 ENCOUNTER — Telehealth: Payer: Self-pay

## 2016-07-10 MED ORDER — ZOLPIDEM TARTRATE 10 MG PO TABS: 10.0000 mg | ORAL_TABLET | Freq: Every evening | ORAL | 5 refills | Status: DC | PRN

## 2016-07-10 NOTE — Telephone Encounter (Signed)
Rx printed, awaiting MD signature.  

## 2016-07-10 NOTE — Telephone Encounter (Signed)
Ok 30 and 5 RF 

## 2016-07-10 NOTE — Telephone Encounter (Signed)
Rx faxed to [facility].  

## 2016-07-10 NOTE — Telephone Encounter (Signed)
Pt is requesting refill on Ambien.  Last OV: 06/01/2016 Last Fill: 12/16/2015 #30 and 5RF UDS: Not needed for Ambien  Please advise.

## 2016-07-25 DIAGNOSIS — D2262 Melanocytic nevi of left upper limb, including shoulder: Secondary | ICD-10-CM | POA: Diagnosis not present

## 2016-07-25 DIAGNOSIS — L821 Other seborrheic keratosis: Secondary | ICD-10-CM | POA: Diagnosis not present

## 2016-07-25 DIAGNOSIS — Z85828 Personal history of other malignant neoplasm of skin: Secondary | ICD-10-CM | POA: Diagnosis not present

## 2016-07-25 DIAGNOSIS — D1801 Hemangioma of skin and subcutaneous tissue: Secondary | ICD-10-CM | POA: Diagnosis not present

## 2016-09-15 ENCOUNTER — Ambulatory Visit: Payer: BLUE CROSS/BLUE SHIELD | Admitting: Podiatry

## 2017-01-22 ENCOUNTER — Telehealth: Payer: Self-pay

## 2017-01-22 MED ORDER — ZOLPIDEM TARTRATE 10 MG PO TABS
10.0000 mg | ORAL_TABLET | Freq: Every evening | ORAL | 5 refills | Status: DC | PRN
Start: 2017-01-22 — End: 2017-07-30

## 2017-01-22 NOTE — Telephone Encounter (Signed)
Pt is requesting refill on Ambien.  Last OV: 06/01/2016 Last Fill: 07/10/2016 #30 and 5RF UDS: Not needed for Ambien per PCP  Please advise.

## 2017-01-22 NOTE — Telephone Encounter (Signed)
Refills phoned in to [facility] pharmacy.

## 2017-01-22 NOTE — Telephone Encounter (Signed)
Ok 30, 5 RF 

## 2017-06-08 ENCOUNTER — Encounter: Payer: BLUE CROSS/BLUE SHIELD | Admitting: Internal Medicine

## 2017-06-29 ENCOUNTER — Other Ambulatory Visit: Payer: Self-pay | Admitting: Internal Medicine

## 2017-07-02 ENCOUNTER — Encounter: Payer: Self-pay | Admitting: Internal Medicine

## 2017-07-02 ENCOUNTER — Ambulatory Visit (INDEPENDENT_AMBULATORY_CARE_PROVIDER_SITE_OTHER): Payer: BLUE CROSS/BLUE SHIELD | Admitting: Internal Medicine

## 2017-07-02 VITALS — BP 132/80 | HR 88 | Temp 98.0°F | Resp 14 | Ht 72.0 in | Wt 190.0 lb

## 2017-07-02 DIAGNOSIS — Z0000 Encounter for general adult medical examination without abnormal findings: Secondary | ICD-10-CM | POA: Diagnosis not present

## 2017-07-02 NOTE — Progress Notes (Signed)
Subjective:    Patient ID: Miguel Brooks, male    DOB: [birth year], 56 y.o.   MRN: [id number]  DOS:  07/02/2017 Type of visit - description : cpx Interval history:No major concerns, feeling well, here lately has been more active.  BP Readings from Last 3 Encounters:  07/02/17 132/80  06/01/16 126/78  03/10/16 (!) 144/66     Review of Systems  A 14 point review of systems is negative    Past Medical History:  Diagnosis Date  . Allergic rhinitis   . Anxiety   . Hypertension   . Insomnia     Past Surgical History:  Procedure Laterality Date  . NO PAST SURGERIES      Social History   Social History  . Marital status: Married    Spouse name: N/A  . Number of children: 2  . Years of education: N/A   Occupational History  . sales - [employer]    Social History Main Topics  . Smoking status: Never Smoker  . Smokeless tobacco: Never Used  . Alcohol use 0.0 oz/week     Comment: rare  . Drug use: No  . Sexual activity: Not on file   Other Topics Concern  . Not on file   Social History Narrative   Lives w/ wife, children grown , household is pt and wife   2 children (from wife), 4 Gkids                  Family History  Problem Relation Age of Onset  . Diabetes Father        borderline  . Cancer - Other Father        mixofibrosarcoma  . Heart attack Mother 31       pass away  . Colon cancer Neg Hx   . Prostate cancer Neg Hx   . Stroke Neg Hx      Allergies as of 07/02/2017      Reactions   Aleve [naproxen Sodium] Swelling      Medication List       Accurate as of 07/02/17 11:59 PM. Always use your most recent med list.          ALPRAZolam 0.25 MG tablet Commonly known as:  XANAX Take 1 tablet (0.25 mg total) by mouth 2 (two) times daily as needed for anxiety.   amLODipine 5 MG tablet Commonly known as:  NORVASC Take 1 tablet (5 mg total) by mouth daily.   cetirizine-pseudoephedrine 5-120 MG tablet Commonly known as:  ZYRTEC-D Take 1  tablet by mouth 2 (two) times daily.   traMADol 50 MG tablet Commonly known as:  ULTRAM Take 1 tablet (50 mg total) by mouth every 8 (eight) hours as needed.   zolpidem 10 MG tablet Commonly known as:  AMBIEN Take 1 tablet (10 mg total) by mouth at bedtime as needed for sleep.            Discharge Care Instructions        Start     Ordered   07/02/17 0000  Comp Met (CMET)     07/02/17 1506   07/02/17 0000  Lipid panel     07/02/17 1506   07/02/17 0000  Hemoglobin A1c     07/02/17 1506   07/02/17 0000  TSH     07/02/17 1506         Objective:   Physical Exam BP 132/80 (BP Location: Left Arm, Patient Position: Sitting, Cuff Size:  Normal)   Pulse 88   Temp 98 F (36.7 C) (Oral)   Resp 14   Ht 6' (1.829 m)   Wt 190 lb (86.2 kg)   SpO2 98%   BMI 25.77 kg/m   General:   Well developed, well nourished . NAD.  Neck: No  thyromegaly  HEENT:  Normocephalic . Face symmetric, atraumatic Lungs:  CTA B Normal respiratory effort, no intercostal retractions, no accessory muscle use. Heart: RRR,  no murmur.  No pretibial edema bilaterally  Abdomen:  Not distended, soft, non-tender. No rebound or rigidity.   Skin: Exposed areas without rash. Not pale. Not jaundice Neurologic:  alert & oriented X3.  Speech normal, gait appropriate for age and unassisted Strength symmetric and appropriate for age.  Psych: Cognition and judgment appear intact.  Cooperative with normal attention span and concentration.  Behavior appropriate. No anxious or depressed appearing.    Assessment & Plan:   Assessment HTN Anxiety, insomnia Headaches, tramadol rarely  used to see Dr. Suezanne Cheshire Allergic rhinitis Sees dermatology  PLAN: HTN: On amlodipine. BPs at the office very good, at home they range from 130/80 and 140/90. Recommend no change, goal is < 135/85 , continue checking BPs: appropriate technique and the use of a large cuff encourage. Anxiety, insomnia: Hardly ever uses  Xanax Headaches: Has not used Tramadol for headaches in 2 years. Headaches are not an issue at this time but nevertheless encouraged to call me if he has any unusual or severe headaches.  RTC one year

## 2017-07-02 NOTE — Progress Notes (Signed)
Pre visit review using our clinic review tool, if applicable. No additional management support is needed unless otherwise documented below in the visit note. 

## 2017-07-02 NOTE — Assessment & Plan Note (Signed)
-  Td 05-2015; flu shot discussed, declined, benefits discussed. Wonders about the shingles shot and he is a candidate although may be better for him to wait a few more years. No need for pneumonia shots at this point. -CCS: 04/23/15 with Claudette Head, MD- diverticulosis; f/u 10 years  -Prostate cancer screening: DRE-PSA wnl 2017  - +FH CAD, aspirin? Allergic to Aleve. -Labs: Come back fasting for a CMP, FLP, A1c, TSH -And exercise discussed. He is doing well.

## 2017-07-02 NOTE — Patient Instructions (Signed)
  GO TO THE FRONT DESK Schedule labs to be done this week, fasting  Schedule your next appointment for a physical exam in one year. Sooner if needed    Check the  blood pressure   weekly   Be sure your blood pressure is between 110/65 and  135/85. If it is consistently higher or lower, let me know

## 2017-07-03 ENCOUNTER — Other Ambulatory Visit (INDEPENDENT_AMBULATORY_CARE_PROVIDER_SITE_OTHER): Payer: BLUE CROSS/BLUE SHIELD

## 2017-07-03 DIAGNOSIS — Z0000 Encounter for general adult medical examination without abnormal findings: Secondary | ICD-10-CM

## 2017-07-03 LAB — COMPREHENSIVE METABOLIC PANEL
ALBUMIN: 4.3 g/dL (ref 3.5–5.2)
ALK PHOS: 71 U/L (ref 39–117)
ALT: 24 U/L (ref 0–53)
AST: 17 U/L (ref 0–37)
BUN: 14 mg/dL (ref 6–23)
CALCIUM: 9.2 mg/dL (ref 8.4–10.5)
CO2: 28 mEq/L (ref 19–32)
Chloride: 104 mEq/L (ref 96–112)
Creatinine, Ser: 1.14 mg/dL (ref 0.40–1.50)
GFR: 70.6 mL/min (ref >60.00)
Glucose, Bld: 123 mg/dL — ABNORMAL HIGH (ref 70–99)
POTASSIUM: 4.3 meq/L (ref 3.5–5.1)
SODIUM: 138 meq/L (ref 135–145)
TOTAL PROTEIN: 7 g/dL (ref 6.0–8.3)
Total Bilirubin: 0.5 mg/dL (ref 0.2–1.2)

## 2017-07-03 LAB — TSH: TSH: 1.2 u[IU]/mL (ref 0.35–4.50)

## 2017-07-03 LAB — LIPID PANEL
CHOL/HDL RATIO: 6
CHOLESTEROL: 198 mg/dL (ref 0–200)
HDL: 32.8 mg/dL — AB (ref >39.00)
LDL Cholesterol: 133 mg/dL — ABNORMAL HIGH (ref 0–99)
NonHDL: 165.19
TRIGLYCERIDES: 160 mg/dL — AB (ref 0.0–149.0)
VLDL: 32 mg/dL (ref 0.0–40.0)

## 2017-07-03 LAB — HEMOGLOBIN A1C: Hgb A1c MFr Bld: 5.5 % (ref 4.6–6.5)

## 2017-07-03 NOTE — Assessment & Plan Note (Signed)
HTN: On amlodipine. BPs at the office very good, at home they range from 130/80 and 140/90. Recommend no change, goal is < 135/85 , continue checking BPs: appropriate technique and the use of a large cuff encourage. Anxiety, insomnia: Hardly ever uses Xanax Headaches: Has not used Tramadol for headaches in 2 years. Headaches are not an issue at this time but nevertheless encouraged to call me if he has any unusual or severe headaches.  RTC one year

## 2017-07-04 ENCOUNTER — Encounter: Payer: Self-pay | Admitting: Internal Medicine

## 2017-07-24 DIAGNOSIS — H5211 Myopia, right eye: Secondary | ICD-10-CM | POA: Diagnosis not present

## 2017-07-26 DIAGNOSIS — D2222 Melanocytic nevi of left ear and external auricular canal: Secondary | ICD-10-CM | POA: Diagnosis not present

## 2017-07-26 DIAGNOSIS — Z85828 Personal history of other malignant neoplasm of skin: Secondary | ICD-10-CM | POA: Diagnosis not present

## 2017-07-26 DIAGNOSIS — D1801 Hemangioma of skin and subcutaneous tissue: Secondary | ICD-10-CM | POA: Diagnosis not present

## 2017-07-26 DIAGNOSIS — L821 Other seborrheic keratosis: Secondary | ICD-10-CM | POA: Diagnosis not present

## 2017-07-26 DIAGNOSIS — L570 Actinic keratosis: Secondary | ICD-10-CM | POA: Diagnosis not present

## 2017-07-26 DIAGNOSIS — D224 Melanocytic nevi of scalp and neck: Secondary | ICD-10-CM | POA: Diagnosis not present

## 2017-07-30 ENCOUNTER — Telehealth: Payer: Self-pay

## 2017-07-30 MED ORDER — ZOLPIDEM TARTRATE 10 MG PO TABS: 10.0000 mg | ORAL_TABLET | Freq: Every evening | ORAL | 5 refills | Status: DC | PRN

## 2017-07-30 NOTE — Telephone Encounter (Signed)
Rx printed, awaiting MD signature.  

## 2017-07-30 NOTE — Telephone Encounter (Signed)
Rx faxed to [facility].  

## 2017-07-30 NOTE — Telephone Encounter (Signed)
Okay #30 and 5 refills 

## 2017-07-30 NOTE — Telephone Encounter (Signed)
Pt is requesting refill on Ambien 10mg .   Last OV: 07/02/2017 Last Fill: 01/22/2017 #30 and 5RF UDS: None  NCCR printed; no issues noted  Please advise.

## 2017-09-27 ENCOUNTER — Other Ambulatory Visit: Payer: Self-pay | Admitting: Internal Medicine

## 2017-11-21 DIAGNOSIS — J1189 Influenza due to unidentified influenza virus with other manifestations: Secondary | ICD-10-CM | POA: Diagnosis not present

## 2018-02-04 DIAGNOSIS — J019 Acute sinusitis, unspecified: Secondary | ICD-10-CM | POA: Diagnosis not present

## 2018-02-10 ENCOUNTER — Telehealth: Payer: Self-pay | Admitting: Internal Medicine

## 2018-02-11 NOTE — Telephone Encounter (Signed)
Sent!

## 2018-02-11 NOTE — Telephone Encounter (Signed)
Pt is requesting refill on Ambien .   Last OV: 07/02/2017 Last Fill: 07/30/2017 #30 and 5rf UDS: Not needed for Ambien per PCP  NCCR printed- no discrepancies noted- sent for scanning.

## 2018-07-08 ENCOUNTER — Encounter: Payer: Self-pay | Admitting: Internal Medicine

## 2018-07-08 ENCOUNTER — Ambulatory Visit (INDEPENDENT_AMBULATORY_CARE_PROVIDER_SITE_OTHER): Payer: BLUE CROSS/BLUE SHIELD | Admitting: Internal Medicine

## 2018-07-08 VITALS — BP 124/70 | HR 79 | Temp 98.3°F | Resp 16 | Ht 72.0 in | Wt 194.2 lb

## 2018-07-08 DIAGNOSIS — Z0000 Encounter for general adult medical examination without abnormal findings: Secondary | ICD-10-CM | POA: Diagnosis not present

## 2018-07-08 DIAGNOSIS — Z23 Encounter for immunization: Secondary | ICD-10-CM | POA: Diagnosis not present

## 2018-07-08 DIAGNOSIS — Z125 Encounter for screening for malignant neoplasm of prostate: Secondary | ICD-10-CM | POA: Diagnosis not present

## 2018-07-08 LAB — CBC WITH DIFFERENTIAL/PLATELET
BASOS ABS: 0 10*3/uL (ref 0.0–0.1)
BASOS PCT: 0.7 % (ref 0.0–3.0)
Eosinophils Absolute: 0.2 10*3/uL (ref 0.0–0.7)
Eosinophils Relative: 2.8 % (ref 0.0–5.0)
HCT: 47.8 % (ref 39.0–52.0)
Hemoglobin: 16.5 g/dL (ref 13.0–17.0)
LYMPHS ABS: 1.6 10*3/uL (ref 0.7–4.0)
Lymphocytes Relative: 22.4 % (ref 12.0–46.0)
MCHC: 34.6 g/dL (ref 30.0–36.0)
MCV: 92.2 fl (ref 78.0–100.0)
MONOS PCT: 7.3 % (ref 3.0–12.0)
Monocytes Absolute: 0.5 10*3/uL (ref 0.1–1.0)
NEUTROS ABS: 4.6 10*3/uL (ref 1.4–7.7)
NEUTROS PCT: 66.8 % (ref 43.0–77.0)
PLATELETS: 300 10*3/uL (ref 150.0–400.0)
RBC: 5.19 Mil/uL (ref 4.22–5.81)
RDW: 13.4 % (ref 11.5–15.5)
WBC: 6.9 10*3/uL (ref 4.0–10.5)

## 2018-07-08 LAB — COMPREHENSIVE METABOLIC PANEL
ALT: 24 U/L (ref 0–53)
AST: 19 U/L (ref 0–37)
Albumin: 4.5 g/dL (ref 3.5–5.2)
Alkaline Phosphatase: 68 U/L (ref 39–117)
BILIRUBIN TOTAL: 0.4 mg/dL (ref 0.2–1.2)
BUN: 15 mg/dL (ref 6–23)
CO2: 28 meq/L (ref 19–32)
Calcium: 9.5 mg/dL (ref 8.4–10.5)
Chloride: 104 mEq/L (ref 96–112)
Creatinine, Ser: 1.19 mg/dL (ref 0.40–1.50)
GFR: 66.94 mL/min (ref >60.00)
GLUCOSE: 110 mg/dL — AB (ref 70–99)
Potassium: 4.5 mEq/L (ref 3.5–5.1)
Sodium: 140 mEq/L (ref 135–145)
Total Protein: 7 g/dL (ref 6.0–8.3)

## 2018-07-08 LAB — LIPID PANEL
Cholesterol: 174 mg/dL (ref 0–200)
HDL: 34 mg/dL — ABNORMAL LOW (ref >39.00)
LDL Cholesterol: 112 mg/dL — ABNORMAL HIGH (ref 0–99)
NONHDL: 140.22
Total CHOL/HDL Ratio: 5
Triglycerides: 143 mg/dL (ref 0.0–149.0)
VLDL: 28.6 mg/dL (ref 0.0–40.0)

## 2018-07-08 LAB — PSA: PSA: 0.77 ng/mL (ref 0.10–4.00)

## 2018-07-08 LAB — HEMOGLOBIN A1C: HEMOGLOBIN A1C: 5.7 % (ref 4.6–6.5)

## 2018-07-08 MED ORDER — TRAMADOL HCL 50 MG PO TABS: 50.0000 mg | ORAL_TABLET | Freq: Three times a day (TID) | ORAL | 0 refills | Status: DC | PRN

## 2018-07-08 MED ORDER — AMLODIPINE BESYLATE 10 MG PO TABS: 10.0000 mg | ORAL_TABLET | Freq: Every day | ORAL | 12 refills | Status: DC

## 2018-07-08 NOTE — Progress Notes (Signed)
Pre visit review using our clinic review tool, if applicable. No additional management support is needed unless otherwise documented below in the visit note. 

## 2018-07-08 NOTE — Progress Notes (Signed)
Subjective:    Patient ID: Miguel Brooks, male    DOB: 01/10/1961, 57 y.o.   MRN: [id number]  DOS:  07/08/2018 Type of visit - description : cpx Interval history: Ambulatory BPs occasionally 140s otherwise feels well  BP Readings from Last 3 Encounters:  07/08/18 124/70  07/02/17 132/80  06/01/16 126/78    Review of Systems  A 14 point review of systems is negative    Past Medical History:  Diagnosis Date  . Allergic rhinitis   . Anxiety   . Hypertension   . Insomnia     Past Surgical History:  Procedure Laterality Date  . NO PAST SURGERIES      Social History   Socioeconomic History  . Marital status: Married    Spouse name: Not on file  . Number of children: 2  . Years of education: Not on file  . Highest education level: Not on file  Occupational History  . Occupation: Airline pilot - [employer]  Social Needs  . Financial resource strain: Not on file  . Food insecurity:    Worry: Not on file    Inability: Not on file  . Transportation needs:    Medical: Not on file    Non-medical: Not on file  Tobacco Use  . Smoking status: Never Smoker  . Smokeless tobacco: Never Used  Substance and Sexual Activity  . Alcohol use: Yes    Alcohol/week: 0.0 standard drinks    Comment: rare  . Drug use: No  . Sexual activity: Not on file  Lifestyle  . Physical activity:    Days per week: Not on file    Minutes per session: Not on file  . Stress: Not on file  Relationships  . Social connections:    Talks on phone: Not on file    Gets together: Not on file    Attends religious service: Not on file    Active member of club or organization: Not on file    Attends meetings of clubs or organizations: Not on file    Relationship status: Not on file  . Intimate partner violence:    Fear of current or ex partner: Not on file    Emotionally abused: Not on file    Physically abused: Not on file    Forced sexual activity: Not on file  Other Topics Concern  . Not on file  Social  History Narrative   Lives w/ wife, children grown , household is pt and wife   2 children (from wife), 4 Gkids               Family History  Problem Relation Age of Onset  . Diabetes Father        borderline  . Cancer - Other Father        mixofibrosarcoma  . Heart attack Mother 95       pass away  . Colon cancer Neg Hx   . Prostate cancer Neg Hx   . Stroke Neg Hx      Allergies as of 07/08/2018      Reactions   Aleve [naproxen Sodium] Swelling      Medication List        Accurate as of 07/08/18 11:59 PM. Always use your most recent med list.          amLODipine 10 MG tablet Commonly known as:  NORVASC Take 1 tablet (10 mg total) by mouth daily.   cetirizine-pseudoephedrine 5-120 MG tablet Commonly known  as:  ZYRTEC-D Take 1 tablet by mouth 2 (two) times daily.   traMADol 50 MG tablet Commonly known as:  ULTRAM Take 1 tablet (50 mg total) by mouth every 8 (eight) hours as needed.   zolpidem 10 MG tablet Commonly known as:  AMBIEN Take 1 tablet (10 mg total) by mouth at bedtime as needed for sleep.          Objective:   Physical Exam BP 124/70 (BP Location: Left Arm, Patient Position: Sitting, Cuff Size: Small)   Pulse 79   Temp 98.3 F (36.8 C) (Oral)   Resp 16   Ht 6' (1.829 m)   Wt 194 lb 4 oz (88.1 kg)   SpO2 98%   BMI 26.35 kg/m  General: Well developed, NAD, see BMI.  Neck: No  thyromegaly  HEENT:  Normocephalic . Face symmetric, atraumatic Lungs:  CTA B Normal respiratory effort, no intercostal retractions, no accessory muscle use. Heart: RRR,  no murmur.  No pretibial edema bilaterally  Abdomen:  Not distended, soft, non-tender. No rebound or rigidity.   Skin: Exposed areas without rash. Not pale. Not jaundice Rectal: External abnormalities: none. Normal sphincter tone. No rectal masses or tenderness.  Cromley stools Prostate: Prostate gland firm and smooth, no enlargement, nodularity, tenderness, mass, asymmetry or  induration Neurologic:  alert & oriented X3.  Speech normal, gait appropriate for age and unassisted Strength symmetric and appropriate for age.  Psych: Cognition and judgment appear intact.  Cooperative with normal attention span and concentration.  Behavior appropriate. No anxious or depressed appearing.     Assessment & Plan:   Assessment HTN Anxiety, insomnia Headaches, tramadol rarely  used to see Dr. Thera Flake Allergic rhinitis Sees dermatology  PLAN: HTN: On amlodipine 5 mg, BP is in the 130s, 140s in the ambulatory setting.  Plan: Increase amlodipine to 10 mg, goal is to keep BP less than 130 consistently.  Watch for swelling.  Continue monitor and call if not at goal. Anxiety insomnia: doing well. Uses Ambien on average twice a week.  Has not used Xanax in a while, will DC it from the medication list but restart if needed Headaches: RF tramadol, uses very rarely. RTC 1 year

## 2018-07-08 NOTE — Patient Instructions (Signed)
GO TO THE LAB : Get the blood work     GO TO THE FRONT DESK Schedule your next appointment for a physical exam in 1 year  Come back for your Shingrix No. 2 in 2 to 3 months.  Please make a nurse appointment  Increase amlodipine to 10 mg  Check the  blood pressure  Weekly  Be sure your blood pressure is between 110/65 and  135/85. If it is consistently higher or lower, let me know

## 2018-07-08 NOTE — Assessment & Plan Note (Signed)
-  Td 05-2015; flu shot today.  Shingrix No. 1 today. -CCS: 04/23/15 with Claudette Head, MD- diverticulosis; f/u 10 years  -Prostate cancer screening: DRE today normal, check a PSA   - +FH CAD, aspirin? Allergic to Aleve. -Labs: CMP, CBC, FLP, A1c, PSA -Diet and exercise discussed

## 2018-07-09 NOTE — Assessment & Plan Note (Signed)
HTN: On amlodipine 5 mg, BP is in the 130s, 140s in the ambulatory setting.  Plan: Increase amlodipine to 10 mg, goal is to keep BP less than 130 consistently.  Watch for swelling.  Continue monitor and call if not at goal. Anxiety insomnia: doing well. Uses Ambien on average twice a week.  Has not used Xanax in a while, will DC it from the medication list but restart if needed Headaches: RF tramadol, uses very rarely. RTC 1 year

## 2018-07-15 DIAGNOSIS — H5211 Myopia, right eye: Secondary | ICD-10-CM | POA: Diagnosis not present

## 2018-07-25 DIAGNOSIS — D225 Melanocytic nevi of trunk: Secondary | ICD-10-CM | POA: Diagnosis not present

## 2018-07-25 DIAGNOSIS — L821 Other seborrheic keratosis: Secondary | ICD-10-CM | POA: Diagnosis not present

## 2018-07-25 DIAGNOSIS — D1801 Hemangioma of skin and subcutaneous tissue: Secondary | ICD-10-CM | POA: Diagnosis not present

## 2018-07-25 DIAGNOSIS — Z85828 Personal history of other malignant neoplasm of skin: Secondary | ICD-10-CM | POA: Diagnosis not present

## 2018-08-12 ENCOUNTER — Telehealth: Payer: Self-pay | Admitting: Internal Medicine

## 2018-08-12 NOTE — Telephone Encounter (Signed)
Sent!

## 2018-08-12 NOTE — Telephone Encounter (Signed)
Pt is requesting refill on Ambien.   Last OV: 07/08/2018 Last Fill: 02/11/2018 #30 and 5RF UDS: Not needed per PCP  NCCR currently down

## 2018-09-10 ENCOUNTER — Ambulatory Visit (INDEPENDENT_AMBULATORY_CARE_PROVIDER_SITE_OTHER): Payer: BLUE CROSS/BLUE SHIELD

## 2018-09-10 DIAGNOSIS — Z23 Encounter for immunization: Secondary | ICD-10-CM

## 2018-11-25 ENCOUNTER — Telehealth: Payer: Self-pay | Admitting: Internal Medicine

## 2018-11-26 NOTE — Telephone Encounter (Signed)
sent 

## 2018-11-26 NOTE — Telephone Encounter (Signed)
Refill request for Ambien.   Last OV: 07/08/2018 Last Fill: 08/12/2018 #30 and 1RF UDS: None, Ambien only  NCCR printed- no discrepancies- sent for scanning

## 2019-05-05 ENCOUNTER — Encounter: Payer: Self-pay | Admitting: Internal Medicine

## 2019-06-04 ENCOUNTER — Telehealth: Payer: Self-pay | Admitting: Internal Medicine

## 2019-06-04 NOTE — Telephone Encounter (Signed)
Ambien refill.   Last OV: 07/08/2018, appt scheduled 07/16/2019 Last Fill: 11/26/2018 #30 and 5RF UDS: Ambien only

## 2019-06-04 NOTE — Telephone Encounter (Signed)
Sent!

## 2019-07-15 ENCOUNTER — Other Ambulatory Visit: Payer: Self-pay

## 2019-07-16 ENCOUNTER — Other Ambulatory Visit: Payer: Self-pay

## 2019-07-16 ENCOUNTER — Encounter: Payer: Self-pay | Admitting: Internal Medicine

## 2019-07-16 ENCOUNTER — Ambulatory Visit (INDEPENDENT_AMBULATORY_CARE_PROVIDER_SITE_OTHER): Payer: BLUE CROSS/BLUE SHIELD | Admitting: Internal Medicine

## 2019-07-16 VITALS — BP 141/84 | HR 77 | Temp 97.6°F | Resp 16 | Ht 72.0 in | Wt 191.1 lb

## 2019-07-16 DIAGNOSIS — Z0000 Encounter for general adult medical examination without abnormal findings: Secondary | ICD-10-CM | POA: Diagnosis not present

## 2019-07-16 DIAGNOSIS — Z23 Encounter for immunization: Secondary | ICD-10-CM | POA: Diagnosis not present

## 2019-07-16 NOTE — Progress Notes (Signed)
Subjective:    Patient ID: Miguel Brooks, male    DOB: 01/04/1961, 58 y.o.   MRN: [id number]  DOS:  07/16/2019 Type of visit - description: CPX Feeling great, denies any problems.  Review of Systems A 14 point review of systems is negative    Past Medical History:  Diagnosis Date  . Allergic rhinitis   . Anxiety   . Hypertension   . Insomnia     Past Surgical History:  Procedure Laterality Date  . NO PAST SURGERIES      Social History   Socioeconomic History  . Marital status: Married    Spouse name: Not on file  . Number of children: 2  . Years of education: Not on file  . Highest education level: Not on file  Occupational History  . Occupation: Press photographer - [employer]  Social Needs  . Financial resource strain: Not on file  . Food insecurity    Worry: Not on file    Inability: Not on file  . Transportation needs    Medical: Not on file    Non-medical: Not on file  Tobacco Use  . Smoking status: Never Smoker  . Smokeless tobacco: Never Used  Substance and Sexual Activity  . Alcohol use: Yes    Alcohol/week: 0.0 standard drinks    Comment: rare  . Drug use: No  . Sexual activity: Not on file  Lifestyle  . Physical activity    Days per week: Not on file    Minutes per session: Not on file  . Stress: Not on file  Relationships  . Social Herbalist on phone: Not on file    Gets together: Not on file    Attends religious service: Not on file    Active member of club or organization: Not on file    Attends meetings of clubs or organizations: Not on file    Relationship status: Not on file  . Intimate partner violence    Fear of current or ex partner: Not on file    Emotionally abused: Not on file    Physically abused: Not on file    Forced sexual activity: Not on file  Other Topics Concern  . Not on file  Social History Narrative   Lives w/ wife, children grown , household is pt and wife   2 children (from wife), 6 Gkids             Family  History  Problem Relation Age of Onset  . Diabetes Father        borderline  . Cancer - Other Father        mixofibrosarcoma  . Heart attack Mother 35       pass away  . Colon cancer Neg Hx   . Prostate cancer Neg Hx   . Stroke Neg Hx      Allergies as of 07/16/2019      Reactions   Aleve [naproxen Sodium] Swelling      Medication List       Accurate as of July 16, 2019 11:59 PM. If you have any questions, ask your nurse or doctor.        amLODipine 10 MG tablet Commonly known as: NORVASC Take 1 tablet (10 mg total) by mouth daily.   cetirizine-pseudoephedrine 5-120 MG tablet Commonly known as: ZYRTEC-D Take 1 tablet by mouth 2 (two) times daily.   traMADol 50 MG tablet Commonly known as: ULTRAM Take 1 tablet (50  mg total) by mouth every 8 (eight) hours as needed.   zolpidem 10 MG tablet Commonly known as: AMBIEN TAKE 1 TABLET(10 MG) BY MOUTH AT BEDTIME AS NEEDED FOR SLEEP           Objective:   Physical Exam BP (!) 141/84 (BP Location: Left Arm, Patient Position: Sitting, Cuff Size: Normal)   Pulse 77   Temp 97.6 F (36.4 C) (Temporal)   Resp 16   Ht 6' (1.829 m)   Wt 191 lb 2 oz (86.7 kg)   SpO2 99%   BMI 25.92 kg/m  General: Well developed, NAD, BMI noted Neck: No  thyromegaly  HEENT:  Normocephalic . Face symmetric, atraumatic Lungs:  CTA B Normal respiratory effort, no intercostal retractions, no accessory muscle use. Heart: RRR,  no murmur.  No pretibial edema bilaterally  Abdomen:  Not distended, soft, non-tender. No rebound or rigidity.   Skin: Exposed areas without rash. Not pale. Not jaundice Neurologic:  alert & oriented X3.  Speech normal, gait appropriate for age and unassisted Strength symmetric and appropriate for age.  Psych: Cognition and judgment appear intact.  Cooperative with normal attention span and concentration.  Behavior appropriate. No anxious or depressed appearing.     Assessment     Assessment HTN  Anxiety, insomnia Headaches, tramadol rarely  used to see Dr. Thera Flake Allergic rhinitis Sees dermatology as of 07/2019  PLAN: Here for CPX HTN: Ambulatory BPs normal, this morning 134/80, never > 130s, continue amlodipine. Anxiety insomnia: On Ambien, RF as needed Headaches: Hardly ever takes tramadol RTC 1 year

## 2019-07-16 NOTE — Patient Instructions (Addendum)
GO TO THE LAB : Get the blood work     GO TO THE FRONT DESK Schedule your next appointment  For a physical exam in 1 year      Check the  blood pressure 2 or 3 times a month   BP GOAL is between 110/65 and  135/85. If it is consistently higher or lower, let me know    You can calculate your risk factor in this website: Http://www.cvriskcalculator.com/

## 2019-07-16 NOTE — Progress Notes (Signed)
Pre visit review using our clinic review tool, if applicable. No additional management support is needed unless otherwise documented below in the visit note. 

## 2019-07-16 NOTE — Assessment & Plan Note (Addendum)
-  Td 05-2015.   - S/p shingrix x 2  - flu shot: today -CCS: 04/23/15 with Lucio Edward, MD- diverticulosis; f/u 10 years  -Prostate cancer screening: DRE - PSA within normal 2019 - +FH CAD, not on ASA d/t aleve allergy.   His 10-year risk score is 11.7%, ideal less than 10%, this does not factor in his FH of CAD.  Patient is aware, at some point he might need a statin although he said he will be extremely reluctant to take one. -Labs: CMP, FLP, CBC, A1c -Diet and exercise: Doing great, eating very healthy, walking 2 miles few times a week.

## 2019-07-17 LAB — COMPREHENSIVE METABOLIC PANEL
ALT: 26 U/L (ref 0–53)
AST: 19 U/L (ref 0–37)
Albumin: 4.7 g/dL (ref 3.5–5.2)
Alkaline Phosphatase: 75 U/L (ref 39–117)
BUN: 16 mg/dL (ref 6–23)
CO2: 28 mEq/L (ref 19–32)
Calcium: 9.9 mg/dL (ref 8.4–10.5)
Chloride: 103 mEq/L (ref 96–112)
Creatinine, Ser: 1.19 mg/dL (ref 0.40–1.50)
GFR: 62.76 mL/min (ref >60.00)
Glucose, Bld: 85 mg/dL (ref 70–99)
Potassium: 4.5 mEq/L (ref 3.5–5.1)
Sodium: 139 mEq/L (ref 135–145)
Total Bilirubin: 0.5 mg/dL (ref 0.2–1.2)
Total Protein: 7.1 g/dL (ref 6.0–8.3)

## 2019-07-17 LAB — LIPID PANEL
Cholesterol: 192 mg/dL (ref 0–200)
HDL: 35.5 mg/dL — ABNORMAL LOW (ref >39.00)
LDL Cholesterol: 125 mg/dL — ABNORMAL HIGH (ref 0–99)
NonHDL: 156.04
Total CHOL/HDL Ratio: 5
Triglycerides: 155 mg/dL — ABNORMAL HIGH (ref 0.0–149.0)
VLDL: 31 mg/dL (ref 0.0–40.0)

## 2019-07-17 LAB — CBC WITH DIFFERENTIAL/PLATELET
Basophils Absolute: 0.1 10*3/uL (ref 0.0–0.1)
Basophils Relative: 0.6 % (ref 0.0–3.0)
Eosinophils Absolute: 0.2 10*3/uL (ref 0.0–0.7)
Eosinophils Relative: 2 % (ref 0.0–5.0)
HCT: 47.4 % (ref 39.0–52.0)
Hemoglobin: 15.8 g/dL (ref 13.0–17.0)
Lymphocytes Relative: 21.4 % (ref 12.0–46.0)
Lymphs Abs: 2 10*3/uL (ref 0.7–4.0)
MCHC: 33.4 g/dL (ref 30.0–36.0)
MCV: 95.1 fl (ref 78.0–100.0)
Monocytes Absolute: 0.6 10*3/uL (ref 0.1–1.0)
Monocytes Relative: 6.1 % (ref 3.0–12.0)
Neutro Abs: 6.6 10*3/uL (ref 1.4–7.7)
Neutrophils Relative %: 69.9 % (ref 43.0–77.0)
Platelets: 316 10*3/uL (ref 150.0–400.0)
RBC: 4.98 Mil/uL (ref 4.22–5.81)
RDW: 13.5 % (ref 11.5–15.5)
WBC: 9.4 10*3/uL (ref 4.0–10.5)

## 2019-07-17 LAB — HEMOGLOBIN A1C: Hgb A1c MFr Bld: 5.7 % (ref 4.6–6.5)

## 2019-07-17 NOTE — Assessment & Plan Note (Signed)
Here for CPX HTN: Ambulatory BPs normal, this morning 134/80, never > 130s, continue amlodipine. Anxiety insomnia: On Ambien, RF as needed Headaches: Hardly ever takes tramadol RTC 1 year

## 2019-07-29 DIAGNOSIS — H5211 Myopia, right eye: Secondary | ICD-10-CM | POA: Diagnosis not present

## 2019-07-30 ENCOUNTER — Encounter: Payer: Self-pay | Admitting: Internal Medicine

## 2019-07-30 DIAGNOSIS — D225 Melanocytic nevi of trunk: Secondary | ICD-10-CM | POA: Diagnosis not present

## 2019-07-30 DIAGNOSIS — D485 Neoplasm of uncertain behavior of skin: Secondary | ICD-10-CM | POA: Diagnosis not present

## 2019-07-30 DIAGNOSIS — L820 Inflamed seborrheic keratosis: Secondary | ICD-10-CM | POA: Diagnosis not present

## 2019-07-30 DIAGNOSIS — Z85828 Personal history of other malignant neoplasm of skin: Secondary | ICD-10-CM | POA: Diagnosis not present

## 2019-07-30 DIAGNOSIS — L821 Other seborrheic keratosis: Secondary | ICD-10-CM | POA: Diagnosis not present

## 2019-07-30 DIAGNOSIS — L814 Other melanin hyperpigmentation: Secondary | ICD-10-CM | POA: Diagnosis not present

## 2019-08-01 ENCOUNTER — Other Ambulatory Visit: Payer: Self-pay | Admitting: Internal Medicine

## 2019-10-21 ENCOUNTER — Other Ambulatory Visit: Payer: Self-pay | Admitting: Internal Medicine

## 2019-10-21 NOTE — Telephone Encounter (Signed)
Last written: 06/04/19 Last ov: 07/26/19 Next ov: 07/19/20 Contract: none UDS: none

## 2020-05-07 ENCOUNTER — Telehealth: Payer: Self-pay | Admitting: Internal Medicine

## 2020-05-07 NOTE — Telephone Encounter (Signed)
PDMP okay, Rx sent 

## 2020-05-07 NOTE — Telephone Encounter (Signed)
Ambien refill.   Last OV: 07/16/2019, appt scheduled 07/19/2020 Last Fill: 10/21/2019 #30 and 3RF Pt sig: 1 tab qhs prn UDS: Ambien only

## 2020-06-21 ENCOUNTER — Encounter: Payer: Self-pay | Admitting: Internal Medicine

## 2020-06-21 DIAGNOSIS — Z0000 Encounter for general adult medical examination without abnormal findings: Secondary | ICD-10-CM

## 2020-06-21 DIAGNOSIS — R739 Hyperglycemia, unspecified: Secondary | ICD-10-CM

## 2020-07-15 ENCOUNTER — Other Ambulatory Visit (INDEPENDENT_AMBULATORY_CARE_PROVIDER_SITE_OTHER): Payer: BLUE CROSS/BLUE SHIELD

## 2020-07-15 ENCOUNTER — Other Ambulatory Visit: Payer: Self-pay

## 2020-07-15 DIAGNOSIS — E785 Hyperlipidemia, unspecified: Secondary | ICD-10-CM | POA: Diagnosis not present

## 2020-07-15 DIAGNOSIS — Z0000 Encounter for general adult medical examination without abnormal findings: Secondary | ICD-10-CM | POA: Diagnosis not present

## 2020-07-15 DIAGNOSIS — R739 Hyperglycemia, unspecified: Secondary | ICD-10-CM | POA: Diagnosis not present

## 2020-07-16 LAB — CBC WITH DIFFERENTIAL/PLATELET
Absolute Monocytes: 581 cells/uL (ref 200–950)
Basophils Absolute: 53 cells/uL (ref 0–200)
Basophils Relative: 0.6 %
Eosinophils Absolute: 282 cells/uL (ref 15–500)
Eosinophils Relative: 3.2 %
HCT: 45.1 % (ref 38.5–50.0)
Hemoglobin: 15.8 g/dL (ref 13.2–17.1)
Lymphs Abs: 1654 cells/uL (ref 850–3900)
MCH: 31.7 pg (ref 27.0–33.0)
MCHC: 35 g/dL (ref 32.0–36.0)
MCV: 90.4 fL (ref 80.0–100.0)
MPV: 8.5 fL (ref 7.5–12.5)
Monocytes Relative: 6.6 %
Neutro Abs: 6230 cells/uL (ref 1500–7800)
Neutrophils Relative %: 70.8 %
Platelets: 288 10*3/uL (ref 140–400)
RBC: 4.99 10*6/uL (ref 4.20–5.80)
RDW: 12.4 % (ref 11.0–15.0)
Total Lymphocyte: 18.8 %
WBC: 8.8 10*3/uL (ref 3.8–10.8)

## 2020-07-16 LAB — HEMOGLOBIN A1C
Hgb A1c MFr Bld: 5.5 % of total Hgb (ref <5.7)
Mean Plasma Glucose: 111 (calc)
eAG (mmol/L): 6.2 (calc)

## 2020-07-16 LAB — COMPREHENSIVE METABOLIC PANEL
AG Ratio: 1.8 (calc) (ref 1.0–2.5)
ALT: 20 U/L (ref 9–46)
AST: 15 U/L (ref 10–35)
Albumin: 4.4 g/dL (ref 3.6–5.1)
Alkaline phosphatase (APISO): 73 U/L (ref 35–144)
BUN: 19 mg/dL (ref 7–25)
CO2: 23 mmol/L (ref 20–32)
Calcium: 9.4 mg/dL (ref 8.6–10.3)
Chloride: 105 mmol/L (ref 98–110)
Creat: 1.32 mg/dL (ref 0.70–1.33)
Globulin: 2.4 g/dL (calc) (ref 1.9–3.7)
Glucose, Bld: 108 mg/dL — ABNORMAL HIGH (ref 65–99)
Potassium: 4.7 mmol/L (ref 3.5–5.3)
Sodium: 140 mmol/L (ref 135–146)
Total Bilirubin: 0.6 mg/dL (ref 0.2–1.2)
Total Protein: 6.8 g/dL (ref 6.1–8.1)

## 2020-07-16 LAB — LIPID PANEL
Cholesterol: 199 mg/dL (ref <200)
HDL: 35 mg/dL — ABNORMAL LOW (ref >=40)
LDL Cholesterol (Calc): 137 mg/dL (calc) — ABNORMAL HIGH
Non-HDL Cholesterol (Calc): 164 mg/dL (calc) — ABNORMAL HIGH (ref <130)
Total CHOL/HDL Ratio: 5.7 (calc) — ABNORMAL HIGH (ref <5.0)
Triglycerides: 147 mg/dL (ref <150)

## 2020-07-16 LAB — PSA: PSA: 0.72 ng/mL (ref <=4.0)

## 2020-07-19 ENCOUNTER — Ambulatory Visit (INDEPENDENT_AMBULATORY_CARE_PROVIDER_SITE_OTHER): Payer: BLUE CROSS/BLUE SHIELD | Admitting: Internal Medicine

## 2020-07-19 ENCOUNTER — Encounter: Payer: Self-pay | Admitting: Internal Medicine

## 2020-07-19 ENCOUNTER — Other Ambulatory Visit: Payer: Self-pay

## 2020-07-19 VITALS — BP 137/85 | HR 71 | Temp 98.0°F | Resp 18 | Ht 72.0 in | Wt 188.4 lb

## 2020-07-19 DIAGNOSIS — Z23 Encounter for immunization: Secondary | ICD-10-CM | POA: Diagnosis not present

## 2020-07-19 DIAGNOSIS — Z0000 Encounter for general adult medical examination without abnormal findings: Secondary | ICD-10-CM

## 2020-07-19 NOTE — Assessment & Plan Note (Signed)
-  Recent labs reviewed with the patient -Td 05-2015.   - S/p shingrix x 2 - s/p C-19 vaccines - flu shot: today -CCS: 04/23/15 with Claudette Head, MD- diverticulosis; f/u 10 years  -Prostate cancer screening: DRE today normal, PSA normal. - +FH CAD, not on ASA d/t aleve allergy.  He is doing great with diet, exercising almost daily, feeling great.  Last LDL  137, CV RF 10 years 13.4. d/w pt  benefits of low-dose of statins, he prefers to improve even more his lifestyle.

## 2020-07-19 NOTE — Progress Notes (Signed)
   Subjective:    Patient ID: Miguel Brooks, male    DOB: [birth year], 59 y.o.   MRN: [id number]  DOS:  07/19/2020 Type of visit - description: CPX Since the last office visit he is feeling great  Wt Readings from Last 3 Encounters:  07/19/20 188 lb 6 oz (85.4 kg)  07/16/19 191 lb 2 oz (86.7 kg)  07/08/18 194 lb 4 oz (88.1 kg)     Review of Systems  A 14 point review of systems is negative    Past Medical History:  Diagnosis Date  . Allergic rhinitis   . Anxiety   . Hypertension   . Insomnia     Past Surgical History:  Procedure Laterality Date  . NO PAST SURGERIES      Allergies as of 07/19/2020      Reactions   Aleve [naproxen Sodium] Swelling      Medication List       Accurate as of July 19, 2020  8:49 PM. If you have any questions, ask your nurse or doctor.        STOP taking these medications   traMADol 50 MG tablet Commonly known as: ULTRAM Stopped by: Willow Ora, MD     TAKE these medications   amLODipine 10 MG tablet Commonly known as: NORVASC Take 1 tablet (10 mg total) by mouth daily.   cetirizine-pseudoephedrine 5-120 MG tablet Commonly known as: ZYRTEC-D Take 1 tablet by mouth 2 (two) times daily.   zolpidem 10 MG tablet Commonly known as: AMBIEN TAKE 1 TABLET(10 MG) BY MOUTH AT BEDTIME AS NEEDED FOR SLEEP          Objective:   Physical Exam BP 137/85 (BP Location: Left Arm, Patient Position: Sitting, Cuff Size: Normal)   Pulse 71   Temp 98 F (36.7 C) (Oral)   Resp 18   Ht 6' (1.829 m)   Wt 188 lb 6 oz (85.4 kg)   SpO2 98%   BMI 25.55 kg/m  General: Well developed, NAD, BMI noted Neck: No  thyromegaly  HEENT:  Normocephalic . Face symmetric, atraumatic Lungs:  CTA B Normal respiratory effort, no intercostal retractions, no accessory muscle use. Heart: RRR,  no murmur.  Abdomen:  Not distended, soft, non-tender. No rebound or rigidity.   Lower extremities: no pretibial edema bilaterally DRE: Normal  prostate. Skin: Exposed areas without rash. Not pale. Not jaundice Neurologic:  alert & oriented X3.  Speech normal, gait appropriate for age and unassisted Strength symmetric and appropriate for age.  Psych: Cognition and judgment appear intact.  Cooperative with normal attention span and concentration.  Behavior appropriate. No anxious or depressed appearing.     Assessment     Assessment HTN Anxiety, insomnia Headaches, tramadol rarely  used to see Dr. Thera Flake Allergic rhinitis Sees dermatology as of 07/2019  PLAN: HTN: On amlodipine, ambulatory BPs consistently 120s over 80s. Anxiety insomnia: On Ambien. Headaches: None recently. RTC 1 year    This visit occurred during the SARS-CoV-2 public health emergency.  Safety protocols were in place, including screening questions prior to the visit, additional usage of staff PPE, and extensive cleaning of exam room while observing appropriate contact time as indicated for disinfecting solutions.

## 2020-07-19 NOTE — Progress Notes (Signed)
Pre visit review using our clinic review tool, if applicable. No additional management support is needed unless otherwise documented below in the visit note. 

## 2020-07-19 NOTE — Patient Instructions (Addendum)
Check the  blood pressure  BP GOAL is between 110/65 and  135/85. If it is consistently higher or lower, let me know         GO TO THE FRONT DESK, PLEASE SCHEDULE YOUR APPOINTMENTS Come back for a physical exam in 1 year     GENERAL INFORMATION ABOUT HEALTHY EATING, CHOLESTEROL, HIGH BLOOD PRESSURE, QUIT TOBACCO (if you smoke):  The American Heart Association, www.heart.org  Check the "Life's Simple 7" from the [facility] The American diabetes Association www.diabetes.org  "[facility] A to Z Health Guide" book

## 2020-07-19 NOTE — Assessment & Plan Note (Signed)
HTN: On amlodipine, ambulatory BPs consistently 120s over 80s. Anxiety insomnia: On Ambien. Headaches: None recently. RTC 1 year

## 2020-07-22 ENCOUNTER — Other Ambulatory Visit: Payer: Self-pay | Admitting: Internal Medicine

## 2020-07-29 DIAGNOSIS — D2261 Melanocytic nevi of right upper limb, including shoulder: Secondary | ICD-10-CM | POA: Diagnosis not present

## 2020-07-29 DIAGNOSIS — Z85828 Personal history of other malignant neoplasm of skin: Secondary | ICD-10-CM | POA: Diagnosis not present

## 2020-07-29 DIAGNOSIS — L821 Other seborrheic keratosis: Secondary | ICD-10-CM | POA: Diagnosis not present

## 2020-07-29 DIAGNOSIS — L570 Actinic keratosis: Secondary | ICD-10-CM | POA: Diagnosis not present

## 2020-08-24 DIAGNOSIS — H5211 Myopia, right eye: Secondary | ICD-10-CM | POA: Diagnosis not present

## 2020-11-04 DIAGNOSIS — M13841 Other specified arthritis, right hand: Secondary | ICD-10-CM | POA: Diagnosis not present

## 2020-11-04 DIAGNOSIS — R2231 Localized swelling, mass and lump, right upper limb: Secondary | ICD-10-CM | POA: Diagnosis not present

## 2020-11-25 ENCOUNTER — Telehealth: Payer: Self-pay | Admitting: Internal Medicine

## 2020-11-26 NOTE — Telephone Encounter (Signed)
Requesting: Ambien 10mg  Contract: None UDS: None Last Visit:07/19/2020 Next Visit: 07/20/2021 Last Refill: 05/07/2020 #30 and 3RF  Please Advise

## 2020-11-26 NOTE — Telephone Encounter (Signed)
PDMP okay, Rx sent 

## 2021-02-04 ENCOUNTER — Telehealth: Payer: Self-pay

## 2021-02-04 MED ORDER — TRAMADOL HCL 50 MG PO TABS
50.0000 mg | ORAL_TABLET | Freq: Two times a day (BID) | ORAL | 0 refills | Status: DC | PRN
Start: 2021-02-04 — End: 2021-12-26

## 2021-02-04 NOTE — Telephone Encounter (Signed)
LMOM informing of recommendations. Instructed to call if questions.

## 2021-02-04 NOTE — Telephone Encounter (Signed)
Advise patient: When the pain is not severe: Tylenol If the pain is a little more intense: Use tramadol, I sent the prescription, watch for drowsiness. If he has persistent pain: Needs to talk with his dentist

## 2021-02-04 NOTE — Telephone Encounter (Signed)
Pt called back- he is aware that rx was sent. Has tried Tylenol and ibuprofen but has not touched pain, he is aware to use Tramadol sparingly.

## 2021-02-04 NOTE — Telephone Encounter (Signed)
Caller states he got braces on in August, he experiences pain for a couple of days after they are tightened. Caller would like to get a refill on his Tramadol.

## 2021-03-31 ENCOUNTER — Encounter: Payer: Self-pay | Admitting: Internal Medicine

## 2021-04-26 ENCOUNTER — Other Ambulatory Visit: Payer: Self-pay | Admitting: Internal Medicine

## 2021-06-02 ENCOUNTER — Telehealth: Payer: Self-pay | Admitting: Internal Medicine

## 2021-06-02 NOTE — Telephone Encounter (Signed)
Requesting:Ambien Contract:05/2016  UDS: none Last Visit:07/19/2020 Next Visit:07/20/2021 Last Refill:11/26/2020 4 refills  Please Advise

## 2021-06-03 NOTE — Telephone Encounter (Signed)
PDMP okay, prescription sent 

## 2021-07-20 ENCOUNTER — Other Ambulatory Visit: Payer: Self-pay

## 2021-07-20 ENCOUNTER — Ambulatory Visit (INDEPENDENT_AMBULATORY_CARE_PROVIDER_SITE_OTHER): Payer: BC Managed Care – PPO | Admitting: Internal Medicine

## 2021-07-20 ENCOUNTER — Encounter: Payer: BLUE CROSS/BLUE SHIELD | Admitting: Internal Medicine

## 2021-07-20 ENCOUNTER — Encounter: Payer: Self-pay | Admitting: Internal Medicine

## 2021-07-20 VITALS — BP 128/82 | HR 75 | Temp 97.9°F | Resp 16 | Ht 72.0 in | Wt 189.4 lb

## 2021-07-20 DIAGNOSIS — F419 Anxiety disorder, unspecified: Secondary | ICD-10-CM | POA: Diagnosis not present

## 2021-07-20 DIAGNOSIS — I10 Essential (primary) hypertension: Secondary | ICD-10-CM

## 2021-07-20 DIAGNOSIS — G8929 Other chronic pain: Secondary | ICD-10-CM

## 2021-07-20 DIAGNOSIS — R739 Hyperglycemia, unspecified: Secondary | ICD-10-CM

## 2021-07-20 DIAGNOSIS — Z79899 Other long term (current) drug therapy: Secondary | ICD-10-CM | POA: Diagnosis not present

## 2021-07-20 DIAGNOSIS — Z0000 Encounter for general adult medical examination without abnormal findings: Secondary | ICD-10-CM | POA: Diagnosis not present

## 2021-07-20 DIAGNOSIS — R519 Headache, unspecified: Secondary | ICD-10-CM

## 2021-07-20 LAB — COMPREHENSIVE METABOLIC PANEL
ALT: 30 U/L (ref 0–53)
AST: 24 U/L (ref 0–37)
Albumin: 4.7 g/dL (ref 3.5–5.2)
Alkaline Phosphatase: 76 U/L (ref 39–117)
BUN: 14 mg/dL (ref 6–23)
CO2: 27 mEq/L (ref 19–32)
Calcium: 9.4 mg/dL (ref 8.4–10.5)
Chloride: 103 mEq/L (ref 96–112)
Creatinine, Ser: 1.21 mg/dL (ref 0.40–1.50)
GFR: 65.24 mL/min (ref >60.00)
Glucose, Bld: 103 mg/dL — ABNORMAL HIGH (ref 70–99)
Potassium: 4.5 mEq/L (ref 3.5–5.1)
Sodium: 139 mEq/L (ref 135–145)
Total Bilirubin: 0.7 mg/dL (ref 0.2–1.2)
Total Protein: 7.2 g/dL (ref 6.0–8.3)

## 2021-07-20 LAB — CBC WITH DIFFERENTIAL/PLATELET
Basophils Absolute: 0.1 10*3/uL (ref 0.0–0.1)
Basophils Relative: 0.7 % (ref 0.0–3.0)
Eosinophils Absolute: 0.2 10*3/uL (ref 0.0–0.7)
Eosinophils Relative: 2.4 % (ref 0.0–5.0)
HCT: 46.1 % (ref 39.0–52.0)
Hemoglobin: 15.8 g/dL (ref 13.0–17.0)
Lymphocytes Relative: 19.9 % (ref 12.0–46.0)
Lymphs Abs: 1.6 10*3/uL (ref 0.7–4.0)
MCHC: 34.4 g/dL (ref 30.0–36.0)
MCV: 92.1 fl (ref 78.0–100.0)
Monocytes Absolute: 0.5 10*3/uL (ref 0.1–1.0)
Monocytes Relative: 6.7 % (ref 3.0–12.0)
Neutro Abs: 5.6 10*3/uL (ref 1.4–7.7)
Neutrophils Relative %: 70.3 % (ref 43.0–77.0)
Platelets: 310 10*3/uL (ref 150.0–400.0)
RBC: 5 Mil/uL (ref 4.22–5.81)
RDW: 13.5 % (ref 11.5–15.5)
WBC: 8 10*3/uL (ref 4.0–10.5)

## 2021-07-20 LAB — TSH: TSH: 1.5 u[IU]/mL (ref 0.35–5.50)

## 2021-07-20 LAB — LIPID PANEL
Cholesterol: 205 mg/dL — ABNORMAL HIGH (ref 0–200)
HDL: 41.8 mg/dL (ref >39.00)
LDL Cholesterol: 134 mg/dL — ABNORMAL HIGH (ref 0–99)
NonHDL: 162.87
Total CHOL/HDL Ratio: 5
Triglycerides: 145 mg/dL (ref 0.0–149.0)
VLDL: 29 mg/dL (ref 0.0–40.0)

## 2021-07-20 LAB — HEMOGLOBIN A1C: Hgb A1c MFr Bld: 5.6 % (ref 4.6–6.5)

## 2021-07-20 NOTE — Assessment & Plan Note (Signed)
-  Td 05-2015.   - S/p shingrix x 2 -  C-19 vax booster rec, he is hesitant, benefits >> risks  - flu shot:recommended  -CCS: 04/23/15 with Claudette Head, MD- diverticulosis; f/u 10 years  -Prostate cancer screening: DRE and PSA normal last year - +FH CAD, not on ASA d/t aleve allergy.  CV RF discussed.   - Labs: CMP, FLP, CBC, A1c, TSH, UDS -He is active, takes a walk almost daily.  Trying to eat healthy.

## 2021-07-20 NOTE — Assessment & Plan Note (Signed)
Here for CPX HTN: On amlodipine, BP is very good, at home is always normal. Anxiety, insomnia: Takes Ambien sometimes, controlled, check UDS, contract signed. Headaches: Rarely takes tramadol. Cardiovascular risk: Calculated at 12.8%, I explained him that he qualifies for statins , they  definitely will decrease his risk of heart disease.  He understood the concept, will check FLP RTC 1 year

## 2021-07-20 NOTE — Progress Notes (Signed)
Subjective:    Patient ID: Miguel Brooks, male    DOB: Oct 14, 1960, 60 y.o.   MRN: [id number]  DOS:  07/20/2021 Type of visit - description: cpx Since the last office visit had COVID, he feels fully recuperated. Has no concerns except occasional nocturia without any other LUTS.   Review of Systems  Other than above, a 14 point review of systems is negative     Past Medical History:  Diagnosis Date   Allergic rhinitis    Anxiety    Hypertension    Insomnia     Past Surgical History:  Procedure Laterality Date   NO PAST SURGERIES     Social History   Socioeconomic History   Marital status: Married    Spouse name: Not on file   Number of children: 2   Years of education: Not on file   Highest education level: Not on file  Occupational History   Occupation: sales - [employer]  Tobacco Use   Smoking status: Never   Smokeless tobacco: Never  Substance and Sexual Activity   Alcohol use: Yes    Alcohol/week: 0.0 standard drinks    Comment: rare   Drug use: No   Sexual activity: Not on file  Other Topics Concern   Not on file  Social History Narrative   Lives w/ wife, children grown , household is pt and wife   2 children (from wife), 6 Gkids             Social Determinants of Health   Financial Resource Strain: Not on file  Food Insecurity: Not on file  Transportation Needs: Not on file  Physical Activity: Not on file  Stress: Not on file  Social Connections: Not on file  Intimate Partner Violence: Not on file    Allergies as of 07/20/2021       Reactions   Aleve [naproxen Sodium] Swelling        Medication List        Accurate as of July 20, 2021  6:09 PM. If you have any questions, ask your nurse or doctor.          amLODipine 10 MG tablet Commonly known as: NORVASC TAKE 1 TABLET(10 MG) BY MOUTH DAILY   cetirizine-pseudoephedrine 5-120 MG tablet Commonly known as: ZYRTEC-D Take 1 tablet by mouth 2 (two) times daily.   traMADol 50 MG  tablet Commonly known as: ULTRAM Take 1 tablet (50 mg total) by mouth every 12 (twelve) hours as needed.   zolpidem 10 MG tablet Commonly known as: AMBIEN TAKE 1 TABLET(10 MG) BY MOUTH AT BEDTIME AS NEEDED FOR SLEEP           Objective:   Physical Exam BP 128/82 (BP Location: Left Arm, Patient Position: Sitting, Cuff Size: Small)   Pulse 75   Temp 97.9 F (36.6 C) (Oral)   Resp 16   Ht 6' (1.829 m)   Wt 189 lb 6 oz (85.9 kg)   SpO2 96%   BMI 25.68 kg/m  General: Well developed, NAD, BMI noted Neck: No  thyromegaly  HEENT:  Normocephalic . Face symmetric, atraumatic Lungs:  CTA B Normal respiratory effort, no intercostal retractions, no accessory muscle use. Heart: RRR,  no murmur.  Abdomen:  Not distended, soft, non-tender. No rebound or rigidity.   Lower extremities: no pretibial edema bilaterally  Skin: Exposed areas without rash. Not pale. Not jaundice Neurologic:  alert & oriented X3.  Speech normal, gait appropriate for age and  unassisted Strength symmetric and appropriate for age.  Psych: Cognition and judgment appear intact.  Cooperative with normal attention span and concentration.  Behavior appropriate. No anxious or depressed appearing.     Assessment    ASSESSMENT HTN Anxiety, insomnia Headaches, tramadol rarely  used to see Dr. Thera Flake Allergic rhinitis Sees dermatology as of 07/2019  PLAN: Here for CPX HTN: On amlodipine, BP is very good, at home is always normal. Anxiety, insomnia: Takes Ambien sometimes, controlled, check UDS, contract signed. Headaches: Rarely takes tramadol. Cardiovascular risk: Calculated at 12.8%, I explained him that he qualifies for statins , they  definitely will decrease his risk of heart disease.  He understood the concept, will check FLP RTC 1 year   This visit occurred during the SARS-CoV-2 public health emergency.  Safety protocols were in place, including screening questions prior to the visit, additional  usage of staff PPE, and extensive cleaning of exam room while observing appropriate contact time as indicated for disinfecting solutions.

## 2021-07-20 NOTE — Patient Instructions (Addendum)
Recommend to proceed with the following vaccine at your pharmacy:  COVID #4 Flu vaccine  Check the  blood pressure regularly BP GOAL is between 110/65 and  135/85. If it is consistently higher or lower, let me know      GO TO THE LAB : Get the blood work     GO TO THE FRONT DESK, PLEASE SCHEDULE YOUR APPOINTMENTS Come back for  a physical exam in 1 year

## 2021-07-22 LAB — DRUG MONITORING PANEL 375977 , URINE

## 2021-07-22 LAB — DM TEMPLATE

## 2021-08-01 DIAGNOSIS — L738 Other specified follicular disorders: Secondary | ICD-10-CM | POA: Diagnosis not present

## 2021-08-01 DIAGNOSIS — D2272 Melanocytic nevi of left lower limb, including hip: Secondary | ICD-10-CM | POA: Diagnosis not present

## 2021-08-01 DIAGNOSIS — Z85828 Personal history of other malignant neoplasm of skin: Secondary | ICD-10-CM | POA: Diagnosis not present

## 2021-08-01 DIAGNOSIS — L570 Actinic keratosis: Secondary | ICD-10-CM | POA: Diagnosis not present

## 2021-12-04 ENCOUNTER — Telehealth: Payer: Self-pay | Admitting: Internal Medicine

## 2021-12-05 NOTE — Telephone Encounter (Signed)
Requesting: Ambien 10mg   Contract: 07/20/2021 UDS: n/a Last Visit: 07/20/2021 Next Visit: 07/27/2022 Last Refill: 06/03/2021 #30 and 4RF  Please Advise

## 2021-12-05 NOTE — Telephone Encounter (Signed)
PDMP okay, Rx sent 

## 2021-12-24 ENCOUNTER — Telehealth: Payer: Self-pay | Admitting: Internal Medicine

## 2021-12-26 ENCOUNTER — Telehealth: Payer: Self-pay

## 2021-12-26 NOTE — Telephone Encounter (Signed)
Requesting: tramadol 50mg   ?Contract: 07/20/2021 ?UDS: 07/20/2021 ?Last Visit: 07/20/2021 ?Next Visit: 07/27/2022 ?Last Refill: 02/04/2021 #14 and 0RF ? ?Please Advise ?  ?

## 2021-12-26 NOTE — Telephone Encounter (Signed)
PDMP reviewed, looks okay.  He rarely takes Ultram as needed headaches.  RF sent ?

## 2021-12-26 NOTE — Telephone Encounter (Signed)
PA initiated via Covermymeds; KEY: [id number]. Awaiting determination.  ?

## 2021-12-28 NOTE — Telephone Encounter (Signed)
PA denied. Awaiting denial information.  ? ?Issued no-benefit denial: Certain PRESCRIPTION DRUGS are subject to benefit limitations which may include, but not limited to: the amount dispensed per PRESCRIPTION, per day or per defined time period; per lifetime; per month?s supply; or the amount dispensed per single copayment, if applicable. Note: excess quantities are not covered. These limitations can be found at http://[email or web address]://www.patel-king.com/ ?

## 2022-03-23 DIAGNOSIS — L820 Inflamed seborrheic keratosis: Secondary | ICD-10-CM | POA: Diagnosis not present

## 2022-03-23 DIAGNOSIS — L814 Other melanin hyperpigmentation: Secondary | ICD-10-CM | POA: Diagnosis not present

## 2022-03-23 DIAGNOSIS — D1801 Hemangioma of skin and subcutaneous tissue: Secondary | ICD-10-CM | POA: Diagnosis not present

## 2022-03-23 DIAGNOSIS — Z85828 Personal history of other malignant neoplasm of skin: Secondary | ICD-10-CM | POA: Diagnosis not present

## 2022-03-23 DIAGNOSIS — K130 Diseases of lips: Secondary | ICD-10-CM | POA: Diagnosis not present

## 2022-03-23 DIAGNOSIS — B078 Other viral warts: Secondary | ICD-10-CM | POA: Diagnosis not present

## 2022-04-07 ENCOUNTER — Encounter: Payer: Self-pay | Admitting: Internal Medicine

## 2022-04-07 ENCOUNTER — Other Ambulatory Visit: Payer: Self-pay | Admitting: Internal Medicine

## 2022-04-07 MED ORDER — CARVEDILOL 6.25 MG PO TABS: 6.2500 mg | ORAL_TABLET | Freq: Two times a day (BID) | ORAL | 0 refills | Status: DC

## 2022-04-28 ENCOUNTER — Other Ambulatory Visit: Payer: Self-pay | Admitting: Internal Medicine

## 2022-05-15 ENCOUNTER — Encounter: Payer: Self-pay | Admitting: Internal Medicine

## 2022-05-15 ENCOUNTER — Other Ambulatory Visit: Payer: Self-pay | Admitting: Internal Medicine

## 2022-05-15 MED ORDER — METOPROLOL TARTRATE 25 MG PO TABS: 25.0000 mg | ORAL_TABLET | Freq: Two times a day (BID) | ORAL | 3 refills | Status: DC

## 2022-05-17 ENCOUNTER — Encounter: Payer: Self-pay | Admitting: Family Medicine

## 2022-05-17 ENCOUNTER — Ambulatory Visit (INDEPENDENT_AMBULATORY_CARE_PROVIDER_SITE_OTHER): Payer: BC Managed Care – PPO | Admitting: Family Medicine

## 2022-05-17 VITALS — BP 149/89 | HR 93 | Temp 98.1°F | Ht 72.0 in | Wt 197.2 lb

## 2022-05-17 DIAGNOSIS — N410 Acute prostatitis: Secondary | ICD-10-CM | POA: Diagnosis not present

## 2022-05-17 DIAGNOSIS — R6883 Chills (without fever): Secondary | ICD-10-CM | POA: Diagnosis not present

## 2022-05-17 DIAGNOSIS — R3915 Urgency of urination: Secondary | ICD-10-CM | POA: Diagnosis not present

## 2022-05-17 LAB — POC URINALSYSI DIPSTICK (AUTOMATED)
Bilirubin, UA: NEGATIVE
Blood, UA: NEGATIVE
Glucose, UA: NEGATIVE
Ketones, UA: NEGATIVE
Leukocytes, UA: NEGATIVE
Nitrite, UA: NEGATIVE
Protein, UA: NEGATIVE
Spec Grav, UA: <=1.005 — AB (ref 1.010–1.025)
Urobilinogen, UA: 0.2 E.U./dL
pH, UA: 5 (ref 5.0–8.0)

## 2022-05-17 NOTE — Progress Notes (Signed)
Acute Office Visit  Subjective:     Patient ID: Miguel Brooks, male    DOB: 1961/06/18, 61 y.o.   MRN: [id number]  CC: urinary urgency, clouding    Urinary Tract Infection  This is a new problem. The current episode started yesterday. The problem occurs every urination. The problem has been gradually improving. The pain is at a severity of 0/10. The patient is experiencing no pain. There has been no fever. Associated symptoms include urgency. Pertinent negatives include no chills, discharge, flank pain, frequency, hematuria, hesitancy, nausea, possible pregnancy, sweats or vomiting. Associated symptoms comments: Had brief episode of chills this morning, but that has resolved . He has tried nothing for the symptoms. There is no history of kidney stones or recurrent UTIs.   Patient states he has a felt a little "off" ever since starting coreg - he has already discussed with PCP and is switching to metoprolol once med is in stock at pharmacy. Yesterday he started noticing some urgency and cloudy urine along with feeling a little strange when he voided, but not necessarily pain or burning. This morning he felt chilled for a few hours, but since sitting outside, eating lunch, and getting some work done, he new feels completely normal.      ROS All review of systems negative except what is listed in the HPI      Objective:    BP (!) 149/89 Comment: home readings 120-130/60  Pulse 93   Temp 98.1 F (36.7 C)   Ht 6' (1.829 m)   Wt 197 lb 3.2 oz (89.4 kg)   BMI 26.75 kg/m    Physical Exam Vitals reviewed.  Constitutional:      Appearance: Normal appearance.  Cardiovascular:     Rate and Rhythm: Normal rate and regular rhythm.  Pulmonary:     Effort: Pulmonary effort is normal.     Breath sounds: Normal breath sounds.  Abdominal:     General: Abdomen is flat. Bowel sounds are normal. There is no distension.     Palpations: Abdomen is soft. There is no mass.     Tenderness:  There is no abdominal tenderness. There is no right CVA tenderness, left CVA tenderness, guarding or rebound.  Genitourinary:    Comments: Deferred exam Skin:    General: Skin is warm and dry.  Neurological:     General: No focal deficit present.     Mental Status: He is alert and oriented to person, place, and time. Mental status is at baseline.  Psychiatric:        Mood and Affect: Mood normal.        Behavior: Behavior normal.        Thought Content: Thought content normal.        Judgment: Judgment normal.     Results for orders placed or performed in visit on 05/17/22  POCT Urinalysis Dipstick (Automated)  Result Value Ref Range   Color, UA yellow    Clarity, UA clear    Glucose, UA Negative Negative   Bilirubin, UA neg    Ketones, UA neg    Spec Grav, UA <=1.005 (A) 1.010 - 1.025   Blood, UA neg    pH, UA 5.0 5.0 - 8.0   Protein, UA Negative Negative   Urobilinogen, UA 0.2 0.2 or 1.0 E.U./dL   Nitrite, UA neg    Leukocytes, UA Negative Negative        Assessment & Plan:   1. Urinary urgency 2.  Chills (without fever) Initial urine test is negative, but we will send for culture and let you know results.  Since you had some chills and generalized feelings of things being "off" recently, we will go ahead and check a few labs to ensure we aren't missing something.  Stay well hydrated.   *BP was elevated today, but home readings sound like they are better - please keep a list of daily readings for the next week or two and send to PCP in case we need to make any adjustments.    - POCT Urinalysis Dipstick (Automated) - Urine Culture - CBC - Comprehensive metabolic panel - PSA   Return if symptoms worsen or fail to improve.  Clayborne Dana, NP

## 2022-05-17 NOTE — Patient Instructions (Addendum)
Initial urine test is negative, but we will send for culture and let you know results.  Since you had some chills and generalized feelings of things being "off" recently, we will go ahead and check a few labs to ensure we aren't missing something.  Stay well hydrated.  *BP was elevated today, but home readings sound like they are better - please keep a list of daily readings for the next week or two and send to PCP in case we need to make any adjustments.

## 2022-05-18 LAB — CBC
HCT: 45.9 % (ref 39.0–52.0)
Hemoglobin: 15.3 g/dL (ref 13.0–17.0)
MCHC: 33.4 g/dL (ref 30.0–36.0)
MCV: 94.7 fl (ref 78.0–100.0)
Platelets: 278 10*3/uL (ref 150.0–400.0)
RBC: 4.85 Mil/uL (ref 4.22–5.81)
RDW: 13.5 % (ref 11.5–15.5)
WBC: 16.3 10*3/uL — ABNORMAL HIGH (ref 4.0–10.5)

## 2022-05-18 LAB — COMPREHENSIVE METABOLIC PANEL
ALT: 23 U/L (ref 0–53)
AST: 19 U/L (ref 0–37)
Albumin: 4.5 g/dL (ref 3.5–5.2)
Alkaline Phosphatase: 75 U/L (ref 39–117)
BUN: 17 mg/dL (ref 6–23)
CO2: 25 mEq/L (ref 19–32)
Calcium: 8.9 mg/dL (ref 8.4–10.5)
Chloride: 101 mEq/L (ref 96–112)
Creatinine, Ser: 1.45 mg/dL (ref 0.40–1.50)
GFR: 52.2 mL/min — ABNORMAL LOW (ref >60.00)
Glucose, Bld: 105 mg/dL — ABNORMAL HIGH (ref 70–99)
Potassium: 4.6 mEq/L (ref 3.5–5.1)
Sodium: 139 mEq/L (ref 135–145)
Total Bilirubin: 0.7 mg/dL (ref 0.2–1.2)
Total Protein: 6.9 g/dL (ref 6.0–8.3)

## 2022-05-18 LAB — PSA: PSA: 42.74 ng/mL — ABNORMAL HIGH (ref 0.10–4.00)

## 2022-05-18 MED ORDER — SULFAMETHOXAZOLE-TRIMETHOPRIM 800-160 MG PO TABS: 1.0000 | ORAL_TABLET | Freq: Two times a day (BID) | ORAL | 0 refills | Status: AC

## 2022-05-18 NOTE — Progress Notes (Signed)
Still waiting on the urine culture, but your PSA and WBC level jumped significantly which suggests prostatitis. I am sending in an extended course of antibiotics to take care of this. If you do not have an improvement of symptoms within 7 days or if symptoms worsen significantly, please follow-up. We will let you know if we need to make any changes based on the culture when it comes back.  Recommend following up about a week after finishing the antibiotics to ensure infection has cleared and labs are returning to baseline. Dr. Drue Novel is aware.

## 2022-05-18 NOTE — Addendum Note (Signed)
Addended by: Hyman Hopes B on: 05/18/2022 12:39 PM   Modules accepted: Orders

## 2022-05-19 LAB — URINE CULTURE
MICRO NUMBER:: 13756419
SPECIMEN QUALITY:: ADEQUATE

## 2022-06-13 ENCOUNTER — Telehealth: Payer: Self-pay | Admitting: Internal Medicine

## 2022-06-14 NOTE — Telephone Encounter (Signed)
Requesting: Ambien 10mg   Contract:07/20/21 UDS: 07/20/21 Last Visit: 07/20/21 Next Visit: 07/27/22 Last Refill: 12/05/21 #30 and 5RF  Please Advise

## 2022-06-14 NOTE — Telephone Encounter (Signed)
PDMP review, prescription sent 

## 2022-07-02 ENCOUNTER — Other Ambulatory Visit: Payer: Self-pay | Admitting: Internal Medicine

## 2022-07-27 ENCOUNTER — Emergency Department (HOSPITAL_COMMUNITY): Payer: BC Managed Care – PPO

## 2022-07-27 ENCOUNTER — Telehealth: Payer: Self-pay

## 2022-07-27 ENCOUNTER — Encounter (HOSPITAL_COMMUNITY): Payer: Self-pay | Admitting: Emergency Medicine

## 2022-07-27 ENCOUNTER — Encounter: Payer: Self-pay | Admitting: Internal Medicine

## 2022-07-27 ENCOUNTER — Emergency Department (HOSPITAL_COMMUNITY)
Admission: EM | Admit: 2022-07-27 | Discharge: 2022-07-27 | Disposition: A | Discharge status: Home / Self Care | Payer: BC Managed Care – PPO | Attending: Emergency Medicine | Admitting: Emergency Medicine

## 2022-07-27 ENCOUNTER — Other Ambulatory Visit: Payer: Self-pay

## 2022-07-27 ENCOUNTER — Ambulatory Visit (INDEPENDENT_AMBULATORY_CARE_PROVIDER_SITE_OTHER): Payer: BC Managed Care – PPO | Admitting: Internal Medicine

## 2022-07-27 VITALS — BP 126/70 | HR 63 | Temp 98.0°F | Resp 16 | Ht 72.0 in | Wt 196.2 lb

## 2022-07-27 DIAGNOSIS — Z79899 Other long term (current) drug therapy: Secondary | ICD-10-CM | POA: Insufficient documentation

## 2022-07-27 DIAGNOSIS — R58 Hemorrhage, not elsewhere classified: Secondary | ICD-10-CM | POA: Diagnosis not present

## 2022-07-27 DIAGNOSIS — G8929 Other chronic pain: Secondary | ICD-10-CM

## 2022-07-27 DIAGNOSIS — I10 Essential (primary) hypertension: Secondary | ICD-10-CM

## 2022-07-27 DIAGNOSIS — F419 Anxiety disorder, unspecified: Secondary | ICD-10-CM | POA: Diagnosis not present

## 2022-07-27 DIAGNOSIS — Z23 Encounter for immunization: Secondary | ICD-10-CM | POA: Diagnosis not present

## 2022-07-27 DIAGNOSIS — N410 Acute prostatitis: Secondary | ICD-10-CM

## 2022-07-27 DIAGNOSIS — Z0000 Encounter for general adult medical examination without abnormal findings: Secondary | ICD-10-CM

## 2022-07-27 DIAGNOSIS — E785 Hyperlipidemia, unspecified: Secondary | ICD-10-CM

## 2022-07-27 DIAGNOSIS — Z8249 Family history of ischemic heart disease and other diseases of the circulatory system: Secondary | ICD-10-CM

## 2022-07-27 DIAGNOSIS — R519 Headache, unspecified: Secondary | ICD-10-CM

## 2022-07-27 DIAGNOSIS — R040 Epistaxis: Secondary | ICD-10-CM | POA: Diagnosis not present

## 2022-07-27 LAB — CBC WITH DIFFERENTIAL/PLATELET
Abs Immature Granulocytes: 0.07 10*3/uL (ref 0.00–0.07)
Basophils Absolute: 0.1 10*3/uL (ref 0.0–0.1)
Basophils Absolute: 0.1 10*3/uL (ref 0.0–0.1)
Basophils Relative: 0.9 % (ref 0.0–3.0)
Basophils Relative: 0 %
Eosinophils Absolute: 0.2 10*3/uL (ref 0.0–0.7)
Eosinophils Absolute: 0.2 10*3/uL (ref 0.0–0.5)
Eosinophils Relative: 2.9 % (ref 0.0–5.0)
Eosinophils Relative: 1 %
HCT: 47.1 % (ref 39.0–52.0)
HCT: 49 % (ref 39.0–52.0)
Hemoglobin: 16 g/dL (ref 13.0–17.0)
Hemoglobin: 17.2 g/dL — ABNORMAL HIGH (ref 13.0–17.0)
Immature Granulocytes: 0 %
Lymphocytes Relative: 21.4 % (ref 12.0–46.0)
Lymphocytes Relative: 7 %
Lymphs Abs: 1.7 10*3/uL (ref 0.7–4.0)
Lymphs Abs: 1.2 10*3/uL (ref 0.7–4.0)
MCH: 32.6 pg (ref 26.0–34.0)
MCHC: 33.9 g/dL (ref 30.0–36.0)
MCHC: 35.1 g/dL (ref 30.0–36.0)
MCV: 92.7 fl (ref 78.0–100.0)
MCV: 92.8 fL (ref 80.0–100.0)
Monocytes Absolute: 0.5 10*3/uL (ref 0.1–1.0)
Monocytes Absolute: 0.6 10*3/uL (ref 0.1–1.0)
Monocytes Relative: 6.5 % (ref 3.0–12.0)
Monocytes Relative: 4 %
Neutro Abs: 5.4 10*3/uL (ref 1.4–7.7)
Neutro Abs: 14.8 10*3/uL — ABNORMAL HIGH (ref 1.7–7.7)
Neutrophils Relative %: 68.3 % (ref 43.0–77.0)
Neutrophils Relative %: 88 %
Platelets: 291 10*3/uL (ref 150.0–400.0)
Platelets: 259 10*3/uL (ref 150–400)
RBC: 5.08 Mil/uL (ref 4.22–5.81)
RBC: 5.28 MIL/uL (ref 4.22–5.81)
RDW: 13.7 % (ref 11.5–15.5)
RDW: 13.1 % (ref 11.5–15.5)
WBC: 7.9 10*3/uL (ref 4.0–10.5)
WBC: 16.9 10*3/uL — ABNORMAL HIGH (ref 4.0–10.5)
nRBC: 0 % (ref 0.0–0.2)

## 2022-07-27 LAB — LIPID PANEL
Cholesterol: 200 mg/dL (ref 0–200)
HDL: 37.9 mg/dL — ABNORMAL LOW (ref >39.00)
LDL Cholesterol: 130 mg/dL — ABNORMAL HIGH (ref 0–99)
NonHDL: 161.77
Total CHOL/HDL Ratio: 5
Triglycerides: 159 mg/dL — ABNORMAL HIGH (ref 0.0–149.0)
VLDL: 31.8 mg/dL (ref 0.0–40.0)

## 2022-07-27 LAB — BASIC METABOLIC PANEL
Anion gap: 6 (ref 5–15)
BUN: 15 mg/dL (ref 6–23)
BUN: 21 mg/dL (ref 8–23)
CO2: 27 mEq/L (ref 19–32)
CO2: 26 mmol/L (ref 22–32)
Calcium: 9.3 mg/dL (ref 8.4–10.5)
Calcium: 9.2 mg/dL (ref 8.9–10.3)
Chloride: 103 mEq/L (ref 96–112)
Chloride: 106 mmol/L (ref 98–111)
Creatinine, Ser: 1.23 mg/dL (ref 0.40–1.50)
Creatinine, Ser: 1.24 mg/dL (ref 0.61–1.24)
GFR, Estimated: >60 mL/min (ref >60)
GFR: 63.51 mL/min (ref >60.00)
Glucose, Bld: 112 mg/dL — ABNORMAL HIGH (ref 70–99)
Glucose, Bld: 144 mg/dL — ABNORMAL HIGH (ref 70–99)
Potassium: 4.3 mEq/L (ref 3.5–5.1)
Potassium: 3.7 mmol/L (ref 3.5–5.1)
Sodium: 139 mEq/L (ref 135–145)
Sodium: 138 mmol/L (ref 135–145)

## 2022-07-27 LAB — PSA: PSA: 2.52 ng/mL (ref 0.10–4.00)

## 2022-07-27 MED ORDER — METOCLOPRAMIDE HCL 5 MG/ML IJ SOLN
10.0000 mg | Freq: Once | INTRAMUSCULAR | Status: AC
  Administered 2022-07-27: 10 mg via INTRAVENOUS
  Filled 2022-07-27: qty 2

## 2022-07-27 MED ORDER — HYDRALAZINE HCL 20 MG/ML IJ SOLN
5.0000 mg | Freq: Once | INTRAMUSCULAR | Status: AC
  Administered 2022-07-27: 5 mg via INTRAVENOUS
  Filled 2022-07-27: qty 1

## 2022-07-27 MED ORDER — DIPHENHYDRAMINE HCL 50 MG/ML IJ SOLN
12.5000 mg | Freq: Once | INTRAMUSCULAR | Status: AC
  Administered 2022-07-27: 12.5 mg via INTRAVENOUS
  Filled 2022-07-27: qty 1

## 2022-07-27 MED ORDER — OXYMETAZOLINE HCL 0.05 % NA SOLN
1.0000 | Freq: Once | NASAL | Status: AC
  Administered 2022-07-27: 1 via NASAL
  Filled 2022-07-27: qty 30

## 2022-07-27 MED ORDER — TRANEXAMIC ACID FOR EPISTAXIS
500.0000 mg | Freq: Once | TOPICAL | Status: AC
  Administered 2022-07-27: 500 mg via TOPICAL
  Filled 2022-07-27 (×2): qty 10

## 2022-07-27 NOTE — ED Provider Notes (Signed)
[facility] DEPT Provider Note   CSN: [id number] Arrival date & time: 07/27/22  1658     History  Chief Complaint  Patient presents with   Epistaxis    Miguel Brooks is a 61 y.o. male history of hypertension here presenting with epistaxis.  Patient states that he was just and his annual physical today and everything checks out okay.  He states that he was bent over and tried to tie his shoes and all of a sudden he had uncontrolled nosebleed. He states that he went to the fire station and his blood pressure was over 200 at that time.  He states that he has some headaches. Patient states that he had nosebleeds in the past but not usually this bad.  Patient is not on any blood thinners.  The history is provided by the patient.       Home Medications Prior to Admission medications   Medication Sig Start Date End Date Taking? Authorizing Provider  metoprolol tartrate (LOPRESSOR) 25 MG tablet Take 1 tablet (25 mg total) by mouth 2 (two) times daily. 05/15/22   Colon Branch, MD  traMADol (ULTRAM) 50 MG tablet Take 1 tablet (50 mg total) by mouth every 12 (twelve) hours as needed (Headache). 12/26/21   Colon Branch, MD  zolpidem (AMBIEN) 10 MG tablet TAKE 1 TABLET(10 MG) BY MOUTH AT BEDTIME AS NEEDED FOR SLEEP 06/14/22   Colon Branch, MD      Allergies    Aleve [naproxen sodium]    Review of Systems   Review of Systems  HENT:  Positive for nosebleeds.   All other systems reviewed and are negative.   Physical Exam Updated Vital Signs There were no vitals taken for this visit. Physical Exam Vitals and nursing note reviewed.  Constitutional:      Appearance: Normal appearance.  HENT:     Head: Normocephalic.     Nose:     Comments: Patient has blood in the left anterior Kiesselbach triangle.  Patient has some dried blood on the right nostril but no active bleeding    Mouth/Throat:     Mouth: Mucous membranes are moist.  Eyes:     Extraocular Movements:  Extraocular movements intact.     Pupils: Pupils are equal, round, and reactive to light.  Cardiovascular:     Rate and Rhythm: Normal rate and regular rhythm.     Pulses: Normal pulses.     Heart sounds: Normal heart sounds.  Pulmonary:     Effort: Pulmonary effort is normal.     Breath sounds: Normal breath sounds.  Abdominal:     General: Abdomen is flat.     Palpations: Abdomen is soft.  Musculoskeletal:        General: Normal range of motion.     Cervical back: Normal range of motion and neck supple.  Skin:    General: Skin is warm.     Capillary Refill: Capillary refill takes less than 2 seconds.  Neurological:     General: No focal deficit present.     Mental Status: He is alert and oriented to person, place, and time.  Psychiatric:        Mood and Affect: Mood normal.        Behavior: Behavior normal.     ED Results / Procedures / Treatments   Labs (all labs ordered are listed, but only abnormal results are displayed) Labs Reviewed  CBC WITH DIFFERENTIAL/PLATELET  BASIC METABOLIC  PANEL    EKG None  Radiology No results found.  Procedures .Epistaxis Management  Date/Time: 07/27/2022 8:40 PM  Performed by: Charlynne Pander, MD Authorized by: Charlynne Pander, MD   Consent:    Consent obtained:  Verbal   Consent given by:  Patient Universal protocol:    Patient identity confirmed:  Verbally with patient Procedure details:    Treatment site:  L anterior   Treatment method:  Gel foam   Treatment complexity:  Limited Post-procedure details:    Assessment:  Bleeding stopped   Procedure completion:  Tolerated     Medications Ordered in ED Medications  hydrALAZINE (APRESOLINE) injection 5 mg (has no administration in time range)  oxymetazoline (AFRIN) 0.05 % nasal spray 1 spray (1 spray Each Nare Given 07/27/22 1726)  tranexamic acid (CYKLOKAPRON) 1000 MG/10ML topical solution 500 mg (500 mg Topical Given 07/27/22 1726)    ED Course/ Medical  Decision Making/ A&P                           Medical Decision Making [name] is a 61 y.o. male here presenting with epistaxis and hypertension. Patient had sudden onset of nosebleed associated with some hypertension.  Patient has history of hypertension on metoprolol.  Plan to get CBC and BMP and also try Afrin and TXA.  Patient may need nasal packing.  8:39 PM I was able to suction out some blood clots and able to pack the left anterior nostril with cotton ball soaked with TXA.  Bleeding has stopped.  Since he was hypertensive and had a headache, CT head was ordered and did not show any bleeding.  Blood pressure improved to 160.  Patient takes metoprolol at home.  Stable for discharge.  Told him to monitor his blood pressure and he may need to go up on his metoprolol dose if it is persistently elevated.  If he has nosebleed he can use Afrin as needed  Problems Addressed: Epistaxis: acute illness or injury Hypertension, unspecified type: acute illness or injury  Amount and/or Complexity of Data Reviewed Labs: ordered. Decision-making details documented in ED Course. Radiology: ordered and independent interpretation performed. Decision-making details documented in ED Course.  Risk OTC drugs. Prescription drug management.    Final Clinical Impression(s) / ED Diagnoses Final diagnoses:  None    Rx / DC Orders ED Discharge Orders     None         Charlynne Pander, MD 07/27/22 2044

## 2022-07-27 NOTE — Assessment & Plan Note (Signed)
Here for CPX HTN: On metoprolol, BP well controlled.  No change.  Checking labs Anxiety insomnia: On Ambien as needed Prostatitis: dx 05-17-2022, responded well to prolonged treatment with Bactrim.  Currently asymptomatic, DRE negative, recheck a PSA. CV RF, discussed with the patient, he is hesitant about taking cholesterol medication, we agreed on calcium coronary score that could help him make that decision. RTC 6 months

## 2022-07-27 NOTE — ED Triage Notes (Signed)
Patient present due to epistaxis while taking a walk today. He did complain of a headache earlier, but none while we was walking. BP was normal this morning while at the MD office for a checkup, but 220/116 with EMS. Patient takes metoprolol. EMS administered Afrin.   EMS vitals: 72 HR 98% SPO2 on room air

## 2022-07-27 NOTE — Progress Notes (Signed)
Subjective:    Patient ID: Miguel Brooks, male    DOB: [birth year], 61 y.o.   MRN: [id number]  DOS:  07/27/2022 Type of visit - description: CPX  Since the last office visit, was diagnosed with prostatitis, he is currently completely asymptomatic.  Review of Systems  Other than above, a 14 point review of systems is negative     Past Medical History:  Diagnosis Date   Allergic rhinitis    Anxiety    Hypertension    Insomnia     Past Surgical History:  Procedure Laterality Date   NO PAST SURGERIES     Social History   Socioeconomic History   Marital status: Married    Spouse name: Not on file   Number of children: 2   Years of education: Not on file   Highest education level: Not on file  Occupational History   Occupation: sales - [employer]  Tobacco Use   Smoking status: Never   Smokeless tobacco: Never  Substance and Sexual Activity   Alcohol use: Yes    Alcohol/week: 0.0 standard drinks of alcohol    Comment: rare   Drug use: No   Sexual activity: Not on file  Other Topics Concern   Not on file  Social History Narrative   Lives w/ wife, children grown , household is pt and wife   2 children (from wife), 6 Gkids             Social Determinants of Health   Financial Resource Strain: Not on file  Food Insecurity: Not on file  Transportation Needs: Not on file  Physical Activity: Not on file  Stress: Not on file  Social Connections: Not on file  Intimate Partner Violence: Not on file    Current Outpatient Medications  Medication Instructions   metoprolol tartrate (LOPRESSOR) 25 mg, Oral, 2 times daily   traMADol (ULTRAM) 50 mg, Oral, Every 12 hours PRN   zolpidem (AMBIEN) 10 MG tablet TAKE 1 TABLET(10 MG) BY MOUTH AT BEDTIME AS NEEDED FOR SLEEP       Objective:   Physical Exam BP 126/70   Pulse 63   Temp 98 F (36.7 C) (Oral)   Resp 16   Ht 6' (1.829 m)   Wt 196 lb 4 oz (89 kg)   SpO2 97%   BMI 26.62 kg/m  General: Well developed, NAD, BMI  noted Neck: No  thyromegaly  HEENT:  Normocephalic . Face symmetric, atraumatic Lungs:  CTA B Normal respiratory effort, no intercostal retractions, no accessory muscle use. Heart: RRR,  no murmur.  Abdomen:  Not distended, soft, non-tender. No rebound or rigidity.   Lower extremities: no pretibial edema bilaterally DRE: Normal sphincter tone, no stools, prostate slightly hard to reach but seems normal and not tender Skin: Exposed areas without rash. Not pale. Not jaundice Neurologic:  alert & oriented X3.  Speech normal, gait appropriate for age and unassisted Strength symmetric and appropriate for age.  Psych: Cognition and judgment appear intact.  Cooperative with normal attention span and concentration.  Behavior appropriate. No anxious or depressed appearing.     Assessment     ASSESSMENT HTN Anxiety, insomnia Headaches, tramadol rarely  used to see Dr. Thera Flake Allergic rhinitis Sees dermatology as of  2023  PLAN: Here for CPX HTN: On metoprolol, BP well controlled.  No change.  Checking labs Anxiety insomnia: On Ambien as needed Prostatitis: dx 05-17-2022, responded well to prolonged treatment with Bactrim.  Currently asymptomatic, DRE  negative, recheck a PSA. CV RF, discussed with the patient, he is hesitant about taking cholesterol medication, we agreed on calcium coronary score that could help him make that decision. RTC 6 months

## 2022-07-27 NOTE — Telephone Encounter (Signed)
Pt's wife called- she wanted to make PCP aware that Pt is being taken via ambulance to ED for nose bleeding and elevated BP. Pt has just seen this morning, and she is concerned and wanted Dr. Larose Kells to be aware. Informed that ED provider will send notes to PCP.

## 2022-07-27 NOTE — Assessment & Plan Note (Signed)
-  Td 05-2015.   - S/p shingrix x 2 - RSV vax d/w  pt  -  C-19 vax booster d/w pt  benefits >> risks  - flu shot: today -CCS: 04/23/15 with Lucio Edward, MD- diverticulosis; f/u 10 years  -Prostate cancer screening: History of recent prostatitis, asx now, DRE negative.  Check PSA. - +FH CAD, not on ASA d/t aleve allergy.  See comments under CV RF     - Labs: BMP FLP CBC PSA -He is active, takes a walk almost daily.  +eat healthy. - H-POA d/w pt

## 2022-07-27 NOTE — Discharge Instructions (Signed)
Please continue your current blood pressure medicine.  Please leave the cottonball in the left nostril overnight if possible.  It will come out by itself if you blow your nose.  If you have persistent nosebleed, I recommend put Afrin and hold pressure for 20 minutes.  See your doctor in a week to recheck your blood pressure  Return to ER if you have worse nosebleed, headache

## 2022-07-27 NOTE — Patient Instructions (Addendum)
We will schedule calcium coronary score  Vaccines I recommend:  Covid booster  Check the  blood pressure regularly BP GOAL is between 110/65 and  135/85. If it is consistently higher or lower, let me know     GO TO THE LAB : Get the blood work     [place], [place] back for   checkup in 6 months    Advanced care planning:  Do you have a "Living will", "[place] of attorney" ?   If you already have a living will or healthcare power of attorney, is recommended you bring the copy to be scanned in your chart. The document will be available to all the doctors you see in the system.  If you don't have one, please consider create one.  Advance care planning is a process that supports adults in  understanding and sharing their preferences regarding future medical care.   The patient's preferences are recorded in documents called Advance Directives.    Advanced directives are completed (and can be modified at any time) while the patient is in full mental capacity.   The documentation should be available at all times to the patient, the family and the healthcare providers.   This legal documents direct treatment decision making and/or appoint a surrogate to make the decision if the patient is not capable to do so.    Advance directives can be documented in many types of formats,  documents have names such as:  Lliving will  Durable power of attorney for healthcare (healthcare proxy or healthcare power of attorney)  Combined directives  Physician orders for life-sustaining treatment    More information at:  [email or web address]

## 2022-07-28 NOTE — Telephone Encounter (Signed)
Patient was seen here yesterday and was feeling great, BP was normal.  Got a flu shot. Shortly after the visit developed a severe headache, subsequently he bent over and started with a severe nosebleed. Prior to arrival to the ER BP was in the 200s at the fire station. At the ER it was in the 180-160 range. Work-up including blood work and a CT head with air-fluid levels at both maxillary sinuses likely from epistaxis. He was packed and released home after he got hydralazine. This morning, the headache is really mild, the packing came out last night and the patient is confident that all of it came out. He wonders if the headache was reaction to the flu shot and that is possible. As far as the blood pressure, it will be hard to determine if the BP was high because the headache or vice versa. Labs from his physical exam were discussed:-PSA back to normal  -LDL 130, pending coronary calcium score. - Other labs normal. Plan: Check BPs daily or twice daily, call me if not normal Call if headache persist. Otherwise follow-up in 6 months

## 2022-07-29 LAB — DRUG MONITORING PANEL 375977 , URINE

## 2022-07-29 LAB — DM TEMPLATE

## 2022-07-31 ENCOUNTER — Encounter: Payer: Self-pay | Admitting: Internal Medicine

## 2022-07-31 ENCOUNTER — Other Ambulatory Visit: Payer: Self-pay | Admitting: Internal Medicine

## 2022-07-31 MED ORDER — ALPRAZOLAM 0.25 MG PO TABS: 0.2500 mg | ORAL_TABLET | Freq: Two times a day (BID) | ORAL | 0 refills | Status: AC | PRN

## 2022-07-31 MED ORDER — LOSARTAN POTASSIUM 50 MG PO TABS: 50.0000 mg | ORAL_TABLET | Freq: Every day | ORAL | 0 refills | Status: DC

## 2022-07-31 NOTE — Addendum Note (Signed)
Addended byDamita Dunnings D on: 07/31/2022 02:41 PM   Modules accepted: Orders

## 2022-07-31 NOTE — Telephone Encounter (Signed)
On chart review, he has tried Xanax before, okay to restart.  PDMP reviewed, Rx sent.

## 2022-07-31 NOTE — Telephone Encounter (Signed)
Send a prescription for losartan 50 mg 1 tablet daily, #30, no refills

## 2022-07-31 NOTE — Addendum Note (Signed)
Addended by: Kathlene November E on: 07/31/2022 07:19 PM   Modules accepted: Orders

## 2022-07-31 NOTE — Telephone Encounter (Signed)
Rx sent 

## 2022-08-03 ENCOUNTER — Ambulatory Visit (HOSPITAL_BASED_OUTPATIENT_CLINIC_OR_DEPARTMENT_OTHER)
Admission: RE | Admit: 2022-08-03 | Discharge: 2022-08-03 | Disposition: A | Discharge status: Home / Self Care | Payer: BC Managed Care – PPO | Source: Ambulatory Visit | Attending: Internal Medicine | Admitting: Internal Medicine

## 2022-08-03 DIAGNOSIS — Z8249 Family history of ischemic heart disease and other diseases of the circulatory system: Secondary | ICD-10-CM | POA: Insufficient documentation

## 2022-08-07 DIAGNOSIS — L821 Other seborrheic keratosis: Secondary | ICD-10-CM | POA: Diagnosis not present

## 2022-08-07 DIAGNOSIS — D2262 Melanocytic nevi of left upper limb, including shoulder: Secondary | ICD-10-CM | POA: Diagnosis not present

## 2022-08-07 DIAGNOSIS — Z85828 Personal history of other malignant neoplasm of skin: Secondary | ICD-10-CM | POA: Diagnosis not present

## 2022-08-07 DIAGNOSIS — D2261 Melanocytic nevi of right upper limb, including shoulder: Secondary | ICD-10-CM | POA: Diagnosis not present

## 2022-08-07 MED ORDER — ATORVASTATIN CALCIUM 20 MG PO TABS: 20.0000 mg | ORAL_TABLET | Freq: Every day | ORAL | 0 refills | Status: DC

## 2022-08-07 NOTE — Addendum Note (Signed)
Addended by: Damita Dunnings D on: 08/07/2022 11:51 AM   Modules accepted: Orders

## 2022-08-09 ENCOUNTER — Other Ambulatory Visit: Payer: Self-pay | Admitting: Internal Medicine

## 2022-08-09 ENCOUNTER — Encounter: Payer: Self-pay | Admitting: Internal Medicine

## 2022-08-09 MED ORDER — AMLODIPINE BESYLATE 10 MG PO TABS: 10.0000 mg | ORAL_TABLET | Freq: Every day | ORAL | 6 refills | Status: DC

## 2022-08-10 ENCOUNTER — Encounter: Payer: Self-pay | Admitting: Internal Medicine

## 2022-08-10 ENCOUNTER — Ambulatory Visit (INDEPENDENT_AMBULATORY_CARE_PROVIDER_SITE_OTHER): Payer: BC Managed Care – PPO | Admitting: Internal Medicine

## 2022-08-10 VITALS — BP 152/90 | HR 77 | Temp 98.0°F | Resp 18 | Ht 72.0 in | Wt 194.1 lb

## 2022-08-10 DIAGNOSIS — F419 Anxiety disorder, unspecified: Secondary | ICD-10-CM

## 2022-08-10 DIAGNOSIS — R519 Headache, unspecified: Secondary | ICD-10-CM

## 2022-08-10 DIAGNOSIS — I10 Essential (primary) hypertension: Secondary | ICD-10-CM

## 2022-08-10 DIAGNOSIS — R040 Epistaxis: Secondary | ICD-10-CM | POA: Diagnosis not present

## 2022-08-10 NOTE — Progress Notes (Signed)
Subjective:    Patient ID: Miguel Brooks, male    DOB: [birth year], 61 y.o.   MRN: [id number]  DOS:  08/10/2022 Type of visit - description: Follow-up  Last seen 07/27/2022 felt well, shortly after that visit, he developed epistaxis,severe HA ,  BP was quite elevated >>>  in the 200s @ the fire station, at the ER was in the 160s. They were  able to stop the nosebleed, CT of the head was nonacute. Few hours prior to above got a flu shot at this office   Since then he has communicated with me several times, BP remains elevated, I tried losartan but he developed side effects, we agreed to go back on amlodipine despite previous possible side effects.  Because of above, he became very anxious,  d/w pt  SSRI or Xanax, patient preferred Xanax.   Review of Systems Today he feels well. Did report brief visual disturbances on the right feel left field since the last visit, a single episode on each side.  No associated dizziness, diplopia, slurred speech or motor deficits. No further headache or nosebleeds. BP this morning at home  was 143/76.  Past Medical History:  Diagnosis Date   Allergic rhinitis    Anxiety    Hypertension    Insomnia     Past Surgical History:  Procedure Laterality Date   NO PAST SURGERIES      Current Outpatient Medications  Medication Instructions   ALPRAZolam (XANAX) 0.25 mg, Oral, 2 times daily PRN   amLODipine (NORVASC) 10 mg, Oral, Daily   atorvastatin (LIPITOR) 20 mg, Oral, Daily at bedtime   metoprolol tartrate (LOPRESSOR) 25 mg, Oral, 2 times daily   traMADol (ULTRAM) 50 mg, Oral, Every 12 hours PRN   zolpidem (AMBIEN) 10 MG tablet TAKE 1 TABLET(10 MG) BY MOUTH AT BEDTIME AS NEEDED FOR SLEEP       Objective:   Physical Exam BP (!) 160/98   Pulse 77   Temp 98 F (36.7 C) (Oral)   Resp 18   Ht 6' (1.829 m)   Wt 194 lb 2 oz (88.1 kg)   SpO2 99%   BMI 26.33 kg/m  General:   Well developed, NAD, BMI noted. HEENT:  Normocephalic . Face  symmetric, atraumatic. EOMI, pupils equal and reactive Lungs:  CTA B Normal respiratory effort, no intercostal retractions, no accessory muscle use. Heart: RRR,  no murmur.  Lower extremities: no pretibial edema bilaterally  Skin: Not pale. Not jaundice Neurologic:  alert & oriented X3.  Speech normal, gait appropriate for age and unassisted Psych--  Cognition and judgment appear intact.  Cooperative with normal attention span and concentration.  Behavior appropriate. No anxious or depressed appearing.      Assessment     ASSESSMENT HTN Anxiety, insomnia Headaches, tramadol rarely  used to see Dr. Suezanne Cheshire Allergic rhinitis Sees dermatology as of  2023  PLAN: Shortly after the last visit, had a gradual development of headache, epistaxis and high blood pressure.  Please see HPI for the summary of events. Hours prior to all of the above, he had a flu shot. Nosebleed: Resolved Headache: Resolved, did have some visual disturbances but only 2 brief episodes without any other neurological sxs, will see optometry next  week, recommend ER if severe visual disturbances. HTN: Previously on metoprolol, since the last visit BP increased, we briefly tried losartan but he could not tolerate it so he is back on amlodipine despite questionable history of gum swelling with it.  He started amlodipine yesterday, BP this morning at home 143/76, BP today here 152/90. Plan: Continue metoprolol, amlodipine, watch for side effects, continue monitoring BPs. Anxiety: All of the  above created a significant amount of anxiety, discussed SSRI versus Xanax, elected Xanax. High cholesterol:  . Coronary calcium score: 29.4, percentile 47%.  CV RF calculated 18.9.  Prescribed atorvastatin, has not started yet.  Recommend to wait for few more days and then restart.  Labs in 6 weeks Flu shot: All 5 of symptoms started shortly after a flu shot, it is possible there is a relationship, he states he will not take it  again. RTC 6 weeks

## 2022-08-10 NOTE — Patient Instructions (Addendum)
See the optometrist  Continue checking your blood pressures.  Continue the same medications for blood pressure BP GOAL is between 110/65 and  135/85. If it is consistently higher or lower, let me know   Start the cholesterol medication in few days    [place], [place] back for a checkup in 6 weeks

## 2022-08-11 NOTE — Assessment & Plan Note (Signed)
Shortly after the last visit, had a gradual development of headache, epistaxis and high blood pressure.  Please see HPI for the summary of events. Hours prior to all of the above, he had a flu shot. Nosebleed: Resolved Headache: Resolved, did have some visual disturbances but only 2 brief episodes without any other neurological sxs, will see optometry next  week, recommend ER if severe visual disturbances. HTN: Previously on metoprolol, since the last visit BP increased, we briefly tried losartan but he could not tolerate it so he is back on amlodipine despite questionable history of gum swelling with it.  He started amlodipine yesterday, BP this morning at home 143/76, BP today here 152/90. Plan: Continue metoprolol, amlodipine, watch for side effects, continue monitoring BPs. Anxiety: All of the  above created a significant amount of anxiety, discussed SSRI versus Xanax, elected Xanax. High cholesterol:  . Coronary calcium score: 29.4, percentile 47%.  CV RF calculated 18.9.  Prescribed atorvastatin, has not started yet.  Recommend to wait for few more days and then restart.  Labs in 6 weeks Flu shot: All 5 of symptoms started shortly after a flu shot, it is possible there is a relationship, he states he will not take it again. RTC 6 weeks

## 2022-08-15 DIAGNOSIS — H53143 Visual discomfort, bilateral: Secondary | ICD-10-CM | POA: Diagnosis not present

## 2022-09-19 ENCOUNTER — Telehealth: Payer: Self-pay | Admitting: Internal Medicine

## 2022-09-19 NOTE — Telephone Encounter (Signed)
Spoke w/ Pt- informed of information by PCP and Marchelle Folks our Print production planner. Will mail him a copy of the LOT number and NDC of flu shot he received. He stated that [employer] recommended we report this reaction to VA[employer] (vaccine adverse event recording system)- Pt wants it noted that he isn't saying whether to do this or not but wanted to let us know insurance recommended it.

## 2022-09-19 NOTE — Telephone Encounter (Signed)
Do you want this listed as a reaction to the flu shot?

## 2022-09-19 NOTE — Telephone Encounter (Signed)
With patient consent, okay to release what ever information the insurance is asking for. It is possible that the symptoms that drove the patient to the ER are related with a flu shot.  I cannot be 100% sure.

## 2022-09-19 NOTE — Telephone Encounter (Signed)
Pt was calling to cancel an appt and stated that it was financial for the following reason:  Pt stated that he had come in for his CPE with Dr. Drue Novel on 10.19.23 and got his flu shot while he was here. Pt stated after he had left our office, he started having this headache along with a persistent nosebleed and had to go to the ED. Pt stated that ED visit alone incurred charged around ~$3500.  Pt was wanting to know if this issue was reported and his insurance company was asking for the vial number for that shot to investigate further. Pt was also wondering if there were any sort of assistance programs to help him with these costs.   Please Advise.

## 2022-09-20 NOTE — Telephone Encounter (Signed)
Noted, thank you

## 2022-09-25 ENCOUNTER — Ambulatory Visit: Payer: BC Managed Care – PPO | Admitting: Internal Medicine

## 2022-10-26 ENCOUNTER — Encounter: Payer: Self-pay | Admitting: Internal Medicine

## 2022-10-31 ENCOUNTER — Other Ambulatory Visit: Payer: BC Managed Care – PPO

## 2022-11-01 ENCOUNTER — Other Ambulatory Visit (INDEPENDENT_AMBULATORY_CARE_PROVIDER_SITE_OTHER): Payer: BC Managed Care – PPO

## 2022-11-01 DIAGNOSIS — E785 Hyperlipidemia, unspecified: Secondary | ICD-10-CM | POA: Diagnosis not present

## 2022-11-01 LAB — LIPID PANEL
Cholesterol: 129 mg/dL (ref 0–200)
HDL: 34.8 mg/dL — ABNORMAL LOW (ref >39.00)
LDL Cholesterol: 73 mg/dL (ref 0–99)
NonHDL: 93.99
Total CHOL/HDL Ratio: 4
Triglycerides: 105 mg/dL (ref 0.0–149.0)
VLDL: 21 mg/dL (ref 0.0–40.0)

## 2022-11-01 LAB — AST: AST: 24 U/L (ref 0–37)

## 2022-11-01 LAB — ALT: ALT: 39 U/L (ref 0–53)

## 2022-11-02 MED ORDER — ATORVASTATIN CALCIUM 20 MG PO TABS: 20.0000 mg | ORAL_TABLET | Freq: Every day | ORAL | 3 refills | Status: DC

## 2022-11-02 NOTE — Addendum Note (Signed)
Addended by: Damita Dunnings D on: 11/02/2022 11:59 AM   Modules accepted: Orders

## 2023-01-11 ENCOUNTER — Telehealth: Payer: Self-pay | Admitting: Internal Medicine

## 2023-01-12 NOTE — Telephone Encounter (Signed)
Requesting: Ambien 10mg   Contract: 07/20/21 UDS: 07/27/22 Last Visit: 08/10/22 Next Visit: None Last Refill: 06/14/22 #30 and 5RF   Letter sent to Pt informing he is overdue for visit.   Please Advise

## 2023-01-12 NOTE — Telephone Encounter (Signed)
Has an appointment pending 02/12/2023. PDMP okay, 1 month supply sent for Ambien

## 2023-01-26 ENCOUNTER — Ambulatory Visit: Payer: BC Managed Care – PPO | Admitting: Internal Medicine

## 2023-02-12 ENCOUNTER — Ambulatory Visit (INDEPENDENT_AMBULATORY_CARE_PROVIDER_SITE_OTHER): Payer: BC Managed Care – PPO | Admitting: Internal Medicine

## 2023-02-12 ENCOUNTER — Encounter: Payer: Self-pay | Admitting: Internal Medicine

## 2023-02-12 VITALS — BP 138/88 | HR 79 | Temp 98.2°F | Resp 16 | Ht 72.0 in | Wt 195.5 lb

## 2023-02-12 DIAGNOSIS — R739 Hyperglycemia, unspecified: Secondary | ICD-10-CM | POA: Diagnosis not present

## 2023-02-12 DIAGNOSIS — I10 Essential (primary) hypertension: Secondary | ICD-10-CM

## 2023-02-12 DIAGNOSIS — E785 Hyperlipidemia, unspecified: Secondary | ICD-10-CM | POA: Insufficient documentation

## 2023-02-12 LAB — CBC WITH DIFFERENTIAL/PLATELET
Basophils Absolute: 0.1 10*3/uL (ref 0.0–0.1)
Basophils Relative: 0.5 % (ref 0.0–3.0)
Eosinophils Absolute: 0.2 10*3/uL (ref 0.0–0.7)
Eosinophils Relative: 2.2 % (ref 0.0–5.0)
HCT: 46.4 % (ref 39.0–52.0)
Hemoglobin: 16.1 g/dL (ref 13.0–17.0)
Lymphocytes Relative: 15.2 % (ref 12.0–46.0)
Lymphs Abs: 1.6 10*3/uL (ref 0.7–4.0)
MCHC: 34.7 g/dL (ref 30.0–36.0)
MCV: 93.5 fl (ref 78.0–100.0)
Monocytes Absolute: 0.7 10*3/uL (ref 0.1–1.0)
Monocytes Relative: 7.1 % (ref 3.0–12.0)
Neutro Abs: 7.7 10*3/uL (ref 1.4–7.7)
Neutrophils Relative %: 75 % (ref 43.0–77.0)
Platelets: 340 10*3/uL (ref 150.0–400.0)
RBC: 4.96 Mil/uL (ref 4.22–5.81)
RDW: 13.1 % (ref 11.5–15.5)
WBC: 10.3 10*3/uL (ref 4.0–10.5)

## 2023-02-12 LAB — HEMOGLOBIN A1C: Hgb A1c MFr Bld: 5.9 % (ref 4.6–6.5)

## 2023-02-12 NOTE — Assessment & Plan Note (Signed)
HTN: Ambulatory BPs in the 135, 125 range.  Currently on amlodipine 10 mg daily.  Not taking beta-blockers.  No apparent side effects although one time noted some peri-ankle edema in the context of being in a trade show and standing for multiple hours.  Plan: No change.  Checking CBC (leukocytosis noted on lab review) Dyslipidemia: Since the last visit, started atorvastatin, initially felt somewhat tired but that quickly resolved.  LDL greatly improved.  Patient wonders if he could take less atorvastatin, he is on a low-dose, having a great response, no side effects, we agree for now no change. Hyperglycemia: Noted on chart review, check A1c. RTC 07-2023 for CPX

## 2023-02-12 NOTE — Patient Instructions (Addendum)
Check the  blood pressure regularly BP GOAL is between 110/65 and  135/85. If it is consistently higher or lower, let me know      GO TO THE LAB : Get the blood work     GO TO THE FRONT DESK, PLEASE SCHEDULE YOUR APPOINTMENTS Come back for physical exam by 07-2023

## 2023-02-12 NOTE — Progress Notes (Signed)
   Subjective:    Patient ID: Miguel Brooks, male    DOB: 12-29-1960, 62 y.o.   MRN: [id number]  DOS:  02/12/2023 Type of visit - description: Checkup  Since last visit he started atorvastatin, initially felt slightly tired, side effect?  That seems to be better. Did not have any aches and pains.  HTN: Ambulatory BP always less than 135/80.   Review of Systems See above   Past Medical History:  Diagnosis Date   Allergic rhinitis    Anxiety    Hypertension    Insomnia     Past Surgical History:  Procedure Laterality Date   NO PAST SURGERIES      Current Outpatient Medications  Medication Instructions   ALPRAZolam (XANAX) 0.25 mg, Oral, 2 times daily PRN   amLODipine (NORVASC) 10 mg, Oral, Daily   atorvastatin (LIPITOR) 20 mg, Oral, Daily at bedtime   metoprolol tartrate (LOPRESSOR) 25 mg, Oral, 2 times daily   traMADol (ULTRAM) 50 mg, Oral, Every 12 hours PRN   zolpidem (AMBIEN) 10 MG tablet TAKE 1 TABLET(10 MG) BY MOUTH AT BEDTIME AS NEEDED FOR SLEEP       Objective:   Physical Exam BP 138/88   Pulse 79   Temp 98.2 F (36.8 C) (Oral)   Resp 16   Ht 6' (1.829 m)   Wt 195 lb 8 oz (88.7 kg)   SpO2 98%   BMI 26.51 kg/m  General:   Well developed, NAD, BMI noted. HEENT:  Normocephalic . Face symmetric, atraumatic Lungs:  CTA B Normal respiratory effort, no intercostal retractions, no accessory muscle use. Heart: RRR,  no murmur.  Lower extremities: no pretibial edema bilaterally  Skin: Not pale. Not jaundice Neurologic:  alert & oriented X3.  Speech normal, gait appropriate for age and unassisted Psych--  Cognition and judgment appear intact.  Cooperative with normal attention span and concentration.  Behavior appropriate. No anxious or depressed appearing.      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  Thera Flake Allergic rhinitis Sees dermatology as of  2023  PLAN: HTN: Ambulatory BPs in the 135, 125 range.  Currently on amlodipine 10 mg daily.  Not taking beta-blockers.  No apparent side effects although one time noted some peri-ankle edema in the context of being in a trade show and standing for multiple hours.  Plan: No change.  Checking CBC (leukocytosis noted on lab review) Dyslipidemia: Since the last visit, started atorvastatin, initially felt somewhat tired but that quickly resolved.  LDL greatly improved.  Patient wonders if he could take less atorvastatin, he is on a low-dose, having a great response, no side effects, we agree for now no change. Hyperglycemia: Noted on chart review, check A1c. RTC 07-2023 for CPX

## 2023-02-15 DIAGNOSIS — L710 Perioral dermatitis: Secondary | ICD-10-CM | POA: Diagnosis not present

## 2023-02-18 ENCOUNTER — Other Ambulatory Visit: Payer: Self-pay | Admitting: Internal Medicine

## 2023-02-21 DIAGNOSIS — J019 Acute sinusitis, unspecified: Secondary | ICD-10-CM | POA: Diagnosis not present

## 2023-02-21 DIAGNOSIS — H9203 Otalgia, bilateral: Secondary | ICD-10-CM | POA: Diagnosis not present

## 2023-02-21 DIAGNOSIS — Z03818 Encounter for observation for suspected exposure to other biological agents ruled out: Secondary | ICD-10-CM | POA: Diagnosis not present

## 2023-03-07 ENCOUNTER — Telehealth: Payer: Self-pay | Admitting: Internal Medicine

## 2023-03-08 NOTE — Telephone Encounter (Signed)
Requesting: Ambien 10mg   Contract: 07/20/21 UDS: Ambien only Last Visit: 02/12/23 Next Visit: 07/16/23 Last Refill: 01/12/23 #30 and 0RF  Please Advise

## 2023-03-08 NOTE — Telephone Encounter (Signed)
PDMP okay, prescription sent 

## 2023-07-16 ENCOUNTER — Encounter: Payer: BC Managed Care – PPO | Admitting: Internal Medicine

## 2023-08-13 DIAGNOSIS — L814 Other melanin hyperpigmentation: Secondary | ICD-10-CM | POA: Diagnosis not present

## 2023-08-13 DIAGNOSIS — L570 Actinic keratosis: Secondary | ICD-10-CM | POA: Diagnosis not present

## 2023-08-13 DIAGNOSIS — D2272 Melanocytic nevi of left lower limb, including hip: Secondary | ICD-10-CM | POA: Diagnosis not present

## 2023-08-13 DIAGNOSIS — Z85828 Personal history of other malignant neoplasm of skin: Secondary | ICD-10-CM | POA: Diagnosis not present

## 2023-08-13 DIAGNOSIS — D225 Melanocytic nevi of trunk: Secondary | ICD-10-CM | POA: Diagnosis not present

## 2023-08-21 ENCOUNTER — Other Ambulatory Visit: Payer: Self-pay | Admitting: Internal Medicine

## 2023-09-12 ENCOUNTER — Encounter: Payer: Self-pay | Admitting: Internal Medicine

## 2023-09-12 ENCOUNTER — Ambulatory Visit (INDEPENDENT_AMBULATORY_CARE_PROVIDER_SITE_OTHER): Payer: BC Managed Care – PPO | Admitting: Internal Medicine

## 2023-09-12 VITALS — BP 138/86 | HR 84 | Temp 97.9°F | Resp 17 | Ht 72.0 in | Wt 197.8 lb

## 2023-09-12 DIAGNOSIS — Z0001 Encounter for general adult medical examination with abnormal findings: Secondary | ICD-10-CM | POA: Diagnosis not present

## 2023-09-12 DIAGNOSIS — Z0000 Encounter for general adult medical examination without abnormal findings: Secondary | ICD-10-CM

## 2023-09-12 DIAGNOSIS — I10 Essential (primary) hypertension: Secondary | ICD-10-CM

## 2023-09-12 DIAGNOSIS — R739 Hyperglycemia, unspecified: Secondary | ICD-10-CM

## 2023-09-12 DIAGNOSIS — E785 Hyperlipidemia, unspecified: Secondary | ICD-10-CM | POA: Diagnosis not present

## 2023-09-12 NOTE — Progress Notes (Addendum)
Subjective:    Patient ID: Miguel Brooks, male    DOB: 1961-06-14, 62 y.o.   MRN: [id number]  DOS:  09/12/2023 Type of visit - description: CPX  Here for CPX. Feeling well. Has no symptoms or concerns.   Review of Systems  A 14 point review of systems is negative    Past Medical History:  Diagnosis Date   Allergic rhinitis    Anxiety    Hypertension    Insomnia     Past Surgical History:  Procedure Laterality Date   NO PAST SURGERIES     Social History   Socioeconomic History   Marital status: Married    Spouse name: Not on file   Number of children: 2   Years of education: Not on file   Highest education level: Not on file  Occupational History   Occupation: sales - [employer]  Tobacco Use   Smoking status: Never   Smokeless tobacco: Never  Substance and Sexual Activity   Alcohol use: Yes    Alcohol/week: 0.0 standard drinks of alcohol    Comment: rare   Drug use: No   Sexual activity: Not on file  Other Topics Concern   Not on file  Social History Narrative   Lives w/ wife, children grown , household is pt and wife   2 children (from wife), 6 Gkids             Social Determinants of Health   Financial Resource Strain: Not on file  Food Insecurity: Not on file  Transportation Needs: Not on file  Physical Activity: Not on file  Stress: Not on file  Social Connections: Not on file  Intimate Partner Violence: Not on file     Current Outpatient Medications  Medication Instructions   ALPRAZolam (XANAX) 0.25 mg, Oral, 2 times daily PRN   amLODipine (NORVASC) 10 mg, Oral, Daily   atorvastatin (LIPITOR) 20 mg, Oral, Daily at bedtime   traMADol (ULTRAM) 50 mg, Oral, Every 12 hours PRN   zolpidem (AMBIEN) 10 MG tablet TAKE 1 TABLET(10 MG) BY MOUTH AT BEDTIME AS NEEDED FOR SLEEP       Objective:   Physical Exam BP 138/86 (BP Location: Left Arm, Patient Position: Sitting, Cuff Size: Normal)   Pulse 84   Temp 97.9 F (36.6 C) (Oral)   Resp 17   Ht  6' (1.829 m)   Wt 197 lb 12.8 oz (89.7 kg)   SpO2 97%   BMI 26.83 kg/m  General: Well developed, NAD, BMI noted Neck: No  thyromegaly  HEENT:  Normocephalic . Face symmetric, atraumatic Lungs:  CTA B Normal respiratory effort, no intercostal retractions, no accessory muscle use. Heart: RRR,  no murmur.  Abdomen:  Not distended, soft, non-tender. No rebound or rigidity.   Lower extremities: no pretibial edema bilaterally  Skin: Exposed areas without rash. Not pale. Not jaundice Neurologic:  alert & oriented X3.  Speech normal, gait appropriate for age and unassisted Strength symmetric and appropriate for age.  Psych: Cognition and judgment appear intact.  Cooperative with normal attention span and concentration.  Behavior appropriate. No anxious or depressed appearing.     Assessment     ASSESSMENT HTN Dyslipidemia 07/2022: Coronary calcium score: 29; 47% percentile ; CV RF ~ 18%. rx statins Anxiety, insomnia Headaches, tramadol rarely  used to see Dr. Thera Flake Allergic rhinitis Sees dermatology as of  2023  PLAN: Here for CPX -Td 05-2015.   - S/p shingrix x 2 - flu  shot: NO, had a severe reaction 2023  -States he will not another vaccine -CCS: 04/23/15 with Claudette Head, MD- diverticulosis; f/u 10 years  -Prostate cancer screening: No symptoms, no FH, check PSA. - +FH CAD, not on ASA d/t aleve allergy.     - Labs: Will come back fasting for CMP FLP CBC A1c PSA -Extensive discussion about a healthy diet and exercise.  He is actually doing well. - Has a healthcare power of attorney. HTN:BP looks good, continue amlodipine, recommend to check BPs from time to time Dyslipidemia: On atorvastatin, checking labs. Anxiety insomnia: Controlled on Ambien, takes Xanax sporadically. Hyperglycemia: The meaning of A1c was extensively discussed with patient.  Checking labs. RTC labs at his earliest convenience RTC CPX 1 year

## 2023-09-12 NOTE — Patient Instructions (Signed)
  Check the  blood pressure regularly Blood pressure goal:  between 110/65 and  135/85. If it is consistently higher or lower, let me know       Schedule your fasting labs Next visit with me in 1 year for a physical     Please schedule it at the front desk        "Health Care Power of attorney" ,  "Living will" (Advance care planning documents)  If you already have a living will or healthcare power of attorney, is recommended you bring the copy to be scanned in your chart.   The document will be available to all the doctors you see in the system.  Advance care planning is a process that supports adults in  understanding and sharing their preferences regarding future medical care.  The patient's preferences are recorded in documents called Advance Directives and the can be modified at any time while the patient is in full mental capacity.   If you don't have one, please consider create one.      More information at: [email or web address]

## 2023-09-13 ENCOUNTER — Encounter: Payer: Self-pay | Admitting: Internal Medicine

## 2023-09-13 NOTE — Assessment & Plan Note (Signed)
Here for CPX -Td 05-2015.   - S/p shingrix x 2 - flu shot: NO, had a severe reaction 2023  -States he will not another vaccine -CCS: 04/23/15 with Claudette Head, MD- diverticulosis; f/u 10 years  -Prostate cancer screening: No symptoms, no FH, check PSA. - +FH CAD, not on ASA d/t aleve allergy.     - Labs: Will come back fasting for CMP FLP CBC A1c PSA -Extensive discussion about a healthy diet and exercise.  He is actually doing well. - Has a healthcare power of attorney.

## 2023-09-13 NOTE — Assessment & Plan Note (Signed)
Here for CPX HTN:BP looks good, continue amlodipine, recommend to check BPs from time to time Dyslipidemia: On atorvastatin, checking labs. Anxiety insomnia: Controlled on Ambien, takes Xanax sporadically. Hyperglycemia: The meaning of A1c was extensively discussed with patient.  Checking labs. RTC labs at his earliest convenience RTC CPX 1 year

## 2023-09-14 ENCOUNTER — Other Ambulatory Visit (INDEPENDENT_AMBULATORY_CARE_PROVIDER_SITE_OTHER): Payer: BC Managed Care – PPO

## 2023-09-14 DIAGNOSIS — R739 Hyperglycemia, unspecified: Secondary | ICD-10-CM

## 2023-09-14 DIAGNOSIS — Z0000 Encounter for general adult medical examination without abnormal findings: Secondary | ICD-10-CM | POA: Diagnosis not present

## 2023-09-14 DIAGNOSIS — E785 Hyperlipidemia, unspecified: Secondary | ICD-10-CM

## 2023-09-14 DIAGNOSIS — I10 Essential (primary) hypertension: Secondary | ICD-10-CM

## 2023-09-14 LAB — LIPID PANEL
Cholesterol: 129 mg/dL (ref 0–200)
HDL: 33.5 mg/dL — ABNORMAL LOW (ref >39.00)
LDL Cholesterol: 77 mg/dL (ref 0–99)
NonHDL: 95.05
Total CHOL/HDL Ratio: 4
Triglycerides: 90 mg/dL (ref 0.0–149.0)
VLDL: 18 mg/dL (ref 0.0–40.0)

## 2023-09-14 LAB — CBC WITH DIFFERENTIAL/PLATELET
Basophils Absolute: 0 10*3/uL (ref 0.0–0.1)
Basophils Relative: 0.5 % (ref 0.0–3.0)
Eosinophils Absolute: 0.3 10*3/uL (ref 0.0–0.7)
Eosinophils Relative: 3 % (ref 0.0–5.0)
HCT: 46.9 % (ref 39.0–52.0)
Hemoglobin: 16.6 g/dL (ref 13.0–17.0)
Lymphocytes Relative: 18.8 % (ref 12.0–46.0)
Lymphs Abs: 1.6 10*3/uL (ref 0.7–4.0)
MCHC: 35.4 g/dL (ref 30.0–36.0)
MCV: 94.7 fL (ref 78.0–100.0)
Monocytes Absolute: 0.6 10*3/uL (ref 0.1–1.0)
Monocytes Relative: 6.9 % (ref 3.0–12.0)
Neutro Abs: 5.9 10*3/uL (ref 1.4–7.7)
Neutrophils Relative %: 70.8 % (ref 43.0–77.0)
Platelets: 311 10*3/uL (ref 150.0–400.0)
RBC: 4.95 Mil/uL (ref 4.22–5.81)
RDW: 13.1 % (ref 11.5–15.5)
WBC: 8.3 10*3/uL (ref 4.0–10.5)

## 2023-09-14 LAB — COMPREHENSIVE METABOLIC PANEL
ALT: 46 U/L (ref 0–53)
AST: 25 U/L (ref 0–37)
Albumin: 4.5 g/dL (ref 3.5–5.2)
Alkaline Phosphatase: 70 U/L (ref 39–117)
BUN: 19 mg/dL (ref 6–23)
CO2: 29 meq/L (ref 19–32)
Calcium: 9.1 mg/dL (ref 8.4–10.5)
Chloride: 105 meq/L (ref 96–112)
Creatinine, Ser: 1.25 mg/dL (ref 0.40–1.50)
GFR: 61.8 mL/min (ref >60.00)
Glucose, Bld: 133 mg/dL — ABNORMAL HIGH (ref 70–99)
Potassium: 4.8 meq/L (ref 3.5–5.1)
Sodium: 140 meq/L (ref 135–145)
Total Bilirubin: 0.5 mg/dL (ref 0.2–1.2)
Total Protein: 6.9 g/dL (ref 6.0–8.3)

## 2023-09-14 LAB — PSA: PSA: 0.82 ng/mL (ref 0.10–4.00)

## 2023-09-14 LAB — HEMOGLOBIN A1C: Hgb A1c MFr Bld: 6 % (ref 4.6–6.5)

## 2023-09-20 ENCOUNTER — Telehealth: Payer: Self-pay | Admitting: Internal Medicine

## 2023-09-20 NOTE — Telephone Encounter (Signed)
PDMP okay, Rx sent 

## 2023-09-20 NOTE — Telephone Encounter (Signed)
Requesting: Ambien 10mg   Contract: 09/12/23 UDS: 07/27/22  Last Visit: 09/12/23 Next Visit: None Last Refill: 03/08/23 #30 and 4RF  Please Advise

## 2024-01-24 ENCOUNTER — Encounter: Payer: Self-pay | Admitting: Internal Medicine

## 2024-02-15 ENCOUNTER — Other Ambulatory Visit: Payer: Self-pay | Admitting: Internal Medicine

## 2024-03-07 ENCOUNTER — Telehealth: Payer: Self-pay | Admitting: Internal Medicine

## 2024-03-07 NOTE — Telephone Encounter (Signed)
 PDMP okay.  Next visit should be 09/2024

## 2024-03-07 NOTE — Telephone Encounter (Signed)
 Requesting:  Ambien  10mg   Contract: 08/08/21 UDS: Ambien  only Last Visit: 09/12/23 Next Visit: None Last Refill: 09/20/23 #30 and 2RF   Please Advise

## 2024-07-30 ENCOUNTER — Ambulatory Visit: Payer: Self-pay

## 2024-07-30 NOTE — Telephone Encounter (Signed)
 FYI Only or Action Required?: FYI only for provider.  Patient was last seen in primary care on 09/12/2023 by Miguel Aloysius BRAVO, MD.  Called Nurse Triage reporting Testicle Pain.  Symptoms began a week ago.  Interventions attempted: Rest, hydration, or home remedies.  Symptoms are: stable.  Triage Disposition: See Physician Within 24 Hours  Patient/caregiver understands and will follow disposition?: Yes       Copied from CRM [id number]. Topic: Clinical - Red Word Triage >> Jul 30, 2024  9:16 AM Berneda FALCON wrote: Red Word that prompted transfer to Nurse Triage: Past week or so, he had a pain in left testicle. Thinks this may be a hernia possibly.  He describes pain as throbbing and hurting, but then a dull pain. Now it is not unbearable but feels something is wrong.  No swelling noted, no pain anywhere else. No lumps or pain during urination. Reason for Disposition  [1] Pain comes and goes (intermittent) AND [2] present > 24 hours  Answer Assessment - Initial Assessment Questions 1. LOCATION and RADIATION: Where is the pain located?      Left testicle 2. QUALITY: What does the pain feel like?  (e.g., sharp, dull, aching, burning)     Tugging, burning pain primarily with sitting 3. SEVERITY: How bad is the pain?  (Scale 1-10; or mild, moderate, severe)   - MILD (1-3): doesn't interfere with normal activities    - MODERATE (4-7): interferes with normal activities (e.g., work or school) or awakens from sleep   - SEVERE (8-10): excruciating pain, unable to do any normal activities, difficulty walking     Mild 4. ONSET: When did the pain start?     X 1 week 5. PATTERN: Does it come and go, or has it been constant since it started?     Intermittent 6. SCROTAL APPEARANCE: What does the scrotum look like? Is there any swelling or redness?      None 7. HERNIA: Has a doctor ever told you that you have a hernia?     None 8. OTHER SYMPTOMS: Do you have any other symptoms?  (e.g., fever, abdominal pain, vomiting, difficulty passing urine)     Mild abd pain  Protocols used: Scrotal Pain-A-AH

## 2024-07-30 NOTE — Telephone Encounter (Signed)
 Appt scheduled

## 2024-07-31 ENCOUNTER — Ambulatory Visit (INDEPENDENT_AMBULATORY_CARE_PROVIDER_SITE_OTHER): Admitting: Family Medicine

## 2024-07-31 ENCOUNTER — Encounter: Payer: Self-pay | Admitting: Family Medicine

## 2024-07-31 VITALS — BP 136/80 | HR 92 | Temp 98.1°F | Resp 16 | Ht 72.0 in | Wt 190.8 lb

## 2024-07-31 DIAGNOSIS — N50812 Left testicular pain: Secondary | ICD-10-CM

## 2024-07-31 MED ORDER — SULFAMETHOXAZOLE-TRIMETHOPRIM 800-160 MG PO TABS: 1.0000 | ORAL_TABLET | Freq: Two times a day (BID) | ORAL | 0 refills | Status: AC

## 2024-07-31 NOTE — Patient Instructions (Addendum)
 Wear supportive undergarments.   OK to take Tylenol 1000 mg (2 extra strength tabs) or 975 mg (3 regular strength tabs) every 6 hours as needed.  Ibuprofen 400-600 mg (2-3 over the counter strength tabs) every 6 hours as needed for pain.  Ice/cold pack over area for 10-15 min twice daily.  Don't take the antibiotic unless we are worsening.  If anything changes, please let me know.  Let us  know if you need anything.

## 2024-07-31 NOTE — Progress Notes (Signed)
 Chief Complaint  Patient presents with   Testicle Pain    Left testicle pain, x1 week, no swelling or redness, pt states he did pick something heavy up about 3 weeks ago and states having pain and pain went away and came back.     Miguel Brooks is a 63 y.o. male here for a skin complaint.  Duration: 1 week Location: L testicle Pruritic? No Painful? Yes Drainage? No Trauma? No Other associated symptoms: more noticeable when he sits compared to standing No redness, fevers, swelling, urinary complaints, new sexual partners, bulges Therapies tried thus far: Tylenol, ibuprofen  Past Medical History:  Diagnosis Date   Allergic rhinitis    Anxiety    Hypertension    Insomnia     BP 136/80 (BP Location: Left Arm, Patient Position: Sitting, Cuff Size: Large)   Pulse 92   Temp 98.1 F (36.7 C) (Oral)   Resp 16   Ht 6' (1.829 m)   Wt 190 lb 12.8 oz (86.5 kg)   SpO2 96%   BMI 25.88 kg/m  Gen: awake, alert, appearing stated age Lungs: No accessory muscle use GU: no ext lesions no gross deformity.  TTP over the epididymis; slight bulge at enlarge with Valsalva in both inguinal canals, more prominent on the left Psych: Age appropriate judgment and insight  Left testicular pain - Plan: sulfamethoxazole -trimethoprim  (BACTRIM  DS) 800-160 MG tablet  I think he has a resolving epididymitis.  I will send in 10 days of Bactrim  should his symptoms worsen or fail to improve over the weekend.  He may have a small hernia though I doubt this is contributing to his symptoms.  If he wishes to proceed with an ultrasound, he will let me know.  Tylenol, NSAIDs, supportive undergarments, and ice recommended otherwise. F/u prn. The patient voiced understanding and agreement to the plan.  Mabel [place], DO 07/31/24 11:57 AM

## 2024-08-07 ENCOUNTER — Telehealth: Payer: Self-pay | Admitting: Internal Medicine

## 2024-08-07 NOTE — Telephone Encounter (Signed)
Pdmp ok, rx sent  ? ?

## 2024-08-07 NOTE — Telephone Encounter (Signed)
 Requesting: Ambien  10mg  Contract: 08/08/21 UDS: Ambien  only Last Visit: 09/12/23, saw Wendling on 07/31/24 Next Visit: None  Last Refill: 03/07/24 #30 and 2RF   Please Advise

## 2024-08-14 DIAGNOSIS — Z85828 Personal history of other malignant neoplasm of skin: Secondary | ICD-10-CM | POA: Diagnosis not present

## 2024-08-14 DIAGNOSIS — D1801 Hemangioma of skin and subcutaneous tissue: Secondary | ICD-10-CM | POA: Diagnosis not present

## 2024-08-14 DIAGNOSIS — L821 Other seborrheic keratosis: Secondary | ICD-10-CM | POA: Diagnosis not present

## 2024-08-14 DIAGNOSIS — L820 Inflamed seborrheic keratosis: Secondary | ICD-10-CM | POA: Diagnosis not present

## 2024-08-14 DIAGNOSIS — L814 Other melanin hyperpigmentation: Secondary | ICD-10-CM | POA: Diagnosis not present

## 2024-08-21 ENCOUNTER — Encounter: Payer: Self-pay | Admitting: Family Medicine

## 2024-08-22 ENCOUNTER — Other Ambulatory Visit: Payer: Self-pay | Admitting: Family Medicine

## 2024-08-22 MED ORDER — SULFAMETHOXAZOLE-TRIMETHOPRIM 800-160 MG PO TABS
1.0000 | ORAL_TABLET | Freq: Two times a day (BID) | ORAL | 0 refills | Status: AC
Start: 2024-08-22 — End: ?

## 2024-09-10 ENCOUNTER — Encounter: Payer: Self-pay | Admitting: Family Medicine

## 2024-09-10 DIAGNOSIS — N50812 Left testicular pain: Secondary | ICD-10-CM

## 2024-09-15 ENCOUNTER — Ambulatory Visit (HOSPITAL_BASED_OUTPATIENT_CLINIC_OR_DEPARTMENT_OTHER): Admission: RE | Admit: 2024-09-15 | Discharge: 2024-09-15 | Attending: Family Medicine

## 2024-09-15 ENCOUNTER — Ambulatory Visit: Payer: Self-pay | Admitting: Family Medicine

## 2024-09-15 ENCOUNTER — Other Ambulatory Visit: Payer: Self-pay | Admitting: Family Medicine

## 2024-09-15 DIAGNOSIS — N50812 Left testicular pain: Secondary | ICD-10-CM | POA: Diagnosis not present

## 2024-09-15 DIAGNOSIS — K4090 Unilateral inguinal hernia, without obstruction or gangrene, not specified as recurrent: Secondary | ICD-10-CM

## 2024-09-19 ENCOUNTER — Ambulatory Visit: Payer: Self-pay | Admitting: Surgery

## 2024-09-19 DIAGNOSIS — K4090 Unilateral inguinal hernia, without obstruction or gangrene, not specified as recurrent: Secondary | ICD-10-CM | POA: Diagnosis not present

## 2024-09-24 ENCOUNTER — Encounter: Payer: Self-pay | Admitting: Internal Medicine

## 2024-09-24 ENCOUNTER — Other Ambulatory Visit: Payer: Self-pay | Admitting: Internal Medicine

## 2024-09-24 ENCOUNTER — Other Ambulatory Visit: Payer: Self-pay | Admitting: Family Medicine

## 2024-10-14 ENCOUNTER — Encounter (HOSPITAL_BASED_OUTPATIENT_CLINIC_OR_DEPARTMENT_OTHER): Payer: Self-pay | Admitting: Surgery

## 2024-10-14 ENCOUNTER — Other Ambulatory Visit: Payer: Self-pay

## 2024-10-14 ENCOUNTER — Encounter (HOSPITAL_BASED_OUTPATIENT_CLINIC_OR_DEPARTMENT_OTHER)
Admission: RE | Admit: 2024-10-14 | Discharge: 2024-10-14 | Disposition: A | Discharge status: Home / Self Care | Source: Ambulatory Visit | Attending: Surgery | Admitting: Surgery

## 2024-10-14 DIAGNOSIS — Z01810 Encounter for preprocedural cardiovascular examination: Secondary | ICD-10-CM | POA: Diagnosis present

## 2024-10-14 MED ORDER — CHLORHEXIDINE GLUCONATE CLOTH 2 % EX PADS: 6.0000 | MEDICATED_PAD | Freq: Once | CUTANEOUS | Status: DC

## 2024-10-14 NOTE — Progress Notes (Signed)

## 2024-10-20 NOTE — Anesthesia Preprocedure Evaluation (Signed)
"                                    Anesthesia Evaluation  Patient identified by MRN, date of birth, ID band Patient awake    Reviewed: Allergy & Precautions, Patient's Chart, lab work & pertinent test results  History of Anesthesia Complications Negative for: history of anesthetic complications  Airway Mallampati: II  TM Distance: >3 FB Neck ROM: Full    Dental no notable dental hx.    Pulmonary neg pulmonary ROS   Pulmonary exam normal        Cardiovascular hypertension, Pt. on medications Normal cardiovascular exam     Neuro/Psych  Headaches  Anxiety        GI/Hepatic Neg liver ROS,,,LEFT INGUINAL HERNIA   Endo/Other  negative endocrine ROS    Renal/GU negative Renal ROS     Musculoskeletal negative musculoskeletal ROS (+)    Abdominal   Peds  Hematology negative hematology ROS (+)   Anesthesia Other Findings   Reproductive/Obstetrics                              Anesthesia Physical Anesthesia Plan  ASA: 2  Anesthesia Plan: General   Post-op Pain Management: Tylenol  PO (pre-op)* and Toradol IV (intra-op)*   Induction: Intravenous  PONV Risk Score and Plan: 2 and Treatment may vary due to age or medical condition, Ondansetron , Dexamethasone  and Midazolam   Airway Management Planned: Oral ETT  Additional Equipment: None  Intra-op Plan:   Post-operative Plan: Extubation in OR  Informed Consent: I have reviewed the patients History and Physical, chart, labs and discussed the procedure including the risks, benefits and alternatives for the proposed anesthesia with the patient or authorized representative who has indicated his/her understanding and acceptance.     Dental advisory given  Plan Discussed with: CRNA  Anesthesia Plan Comments:          Anesthesia Quick Evaluation  "

## 2024-10-21 ENCOUNTER — Encounter (HOSPITAL_BASED_OUTPATIENT_CLINIC_OR_DEPARTMENT_OTHER): Payer: Self-pay | Admitting: Anesthesiology

## 2024-10-21 ENCOUNTER — Encounter (HOSPITAL_BASED_OUTPATIENT_CLINIC_OR_DEPARTMENT_OTHER): Payer: Self-pay | Admitting: Surgery

## 2024-10-21 ENCOUNTER — Encounter (HOSPITAL_BASED_OUTPATIENT_CLINIC_OR_DEPARTMENT_OTHER)
Admission: RE | Disposition: A | Discharge status: Home / Self Care | Payer: Self-pay | Source: Home / Self Care | Attending: Surgery

## 2024-10-21 ENCOUNTER — Ambulatory Visit (HOSPITAL_BASED_OUTPATIENT_CLINIC_OR_DEPARTMENT_OTHER): Payer: Self-pay | Admitting: Anesthesiology

## 2024-10-21 ENCOUNTER — Other Ambulatory Visit: Payer: Self-pay

## 2024-10-21 ENCOUNTER — Ambulatory Visit (HOSPITAL_BASED_OUTPATIENT_CLINIC_OR_DEPARTMENT_OTHER)
Admission: RE | Admit: 2024-10-21 | Discharge: 2024-10-21 | Disposition: A | Discharge status: Home / Self Care | Attending: Surgery | Admitting: Surgery

## 2024-10-21 DIAGNOSIS — I10 Essential (primary) hypertension: Secondary | ICD-10-CM | POA: Insufficient documentation

## 2024-10-21 DIAGNOSIS — K4090 Unilateral inguinal hernia, without obstruction or gangrene, not specified as recurrent: Secondary | ICD-10-CM | POA: Diagnosis present

## 2024-10-21 DIAGNOSIS — K4020 Bilateral inguinal hernia, without obstruction or gangrene, not specified as recurrent: Secondary | ICD-10-CM | POA: Insufficient documentation

## 2024-10-21 DIAGNOSIS — Z01818 Encounter for other preprocedural examination: Secondary | ICD-10-CM

## 2024-10-21 HISTORY — PX: INGUINAL HERNIA REPAIR: SHX194

## 2024-10-21 SURGERY — REPAIR, HERNIA, INGUINAL, LAPAROSCOPIC
Anesthesia: General | Laterality: Bilateral

## 2024-10-21 MED ORDER — MIDAZOLAM HCL 2 MG/2ML IJ SOLN
INTRAMUSCULAR | Status: AC
  Filled 2024-10-21: qty 2

## 2024-10-21 MED ORDER — DEXAMETHASONE SOD PHOSPHATE PF 10 MG/ML IJ SOLN
INTRAMUSCULAR | Status: AC
  Filled 2024-10-21: qty 1

## 2024-10-21 MED ORDER — PHENYLEPHRINE HCL (PRESSORS) 10 MG/ML IV SOLN
INTRAVENOUS | Status: DC | PRN
  Administered 2024-10-21: 80 ug via INTRAVENOUS
  Administered 2024-10-21: 160 ug via INTRAVENOUS
  Administered 2024-10-21: 80 ug via INTRAVENOUS
  Administered 2024-10-21: 160 ug via INTRAVENOUS

## 2024-10-21 MED ORDER — FENTANYL CITRATE (PF) 100 MCG/2ML IJ SOLN
INTRAMUSCULAR | Status: DC | PRN
  Administered 2024-10-21: 100 ug via INTRAVENOUS

## 2024-10-21 MED ORDER — ROCURONIUM BROMIDE 100 MG/10ML IV SOLN
INTRAVENOUS | Status: DC | PRN
  Administered 2024-10-21 (×2): 10 mg via INTRAVENOUS
  Administered 2024-10-21: 60 mg via INTRAVENOUS
  Administered 2024-10-21: 20 mg via INTRAVENOUS

## 2024-10-21 MED ORDER — OXYCODONE HCL 5 MG PO TABS: 5.0000 mg | ORAL_TABLET | Freq: Four times a day (QID) | ORAL | 0 refills | Status: AC | PRN

## 2024-10-21 MED ORDER — PROPOFOL 10 MG/ML IV BOLUS
INTRAVENOUS | Status: DC | PRN
  Administered 2024-10-21: 30 mg via INTRAVENOUS
  Administered 2024-10-21: 170 mg via INTRAVENOUS

## 2024-10-21 MED ORDER — ACETAMINOPHEN 500 MG PO TABS
ORAL_TABLET | ORAL | Status: AC
  Filled 2024-10-21: qty 2

## 2024-10-21 MED ORDER — ONDANSETRON HCL 4 MG/2ML IJ SOLN
INTRAMUSCULAR | Status: AC
  Filled 2024-10-21: qty 2

## 2024-10-21 MED ORDER — LACTATED RINGERS IV SOLN: INTRAVENOUS | Status: DC

## 2024-10-21 MED ORDER — LIDOCAINE 2% (20 MG/ML) 5 ML SYRINGE
INTRAMUSCULAR | Status: AC
  Filled 2024-10-21: qty 5

## 2024-10-21 MED ORDER — FENTANYL CITRATE (PF) 100 MCG/2ML IJ SOLN
25.0000 ug | INTRAMUSCULAR | Status: DC | PRN
  Administered 2024-10-21: 50 ug via INTRAVENOUS

## 2024-10-21 MED ORDER — PROPOFOL 10 MG/ML IV BOLUS
INTRAVENOUS | Status: AC
  Filled 2024-10-21: qty 20

## 2024-10-21 MED ORDER — BUPIVACAINE-EPINEPHRINE 0.25% -1:200000 IJ SOLN
INTRAMUSCULAR | Status: DC | PRN
  Administered 2024-10-21: 15 mL

## 2024-10-21 MED ORDER — MIDAZOLAM HCL 5 MG/5ML IJ SOLN
INTRAMUSCULAR | Status: DC | PRN
  Administered 2024-10-21: 2 mg via INTRAVENOUS

## 2024-10-21 MED ORDER — SUGAMMADEX SODIUM 200 MG/2ML IV SOLN
INTRAVENOUS | Status: DC | PRN
  Administered 2024-10-21: 200 mg via INTRAVENOUS

## 2024-10-21 MED ORDER — CEFAZOLIN SODIUM-DEXTROSE 2-4 GM/100ML-% IV SOLN
2.0000 g | INTRAVENOUS | Status: AC
  Administered 2024-10-21: 2 g via INTRAVENOUS

## 2024-10-21 MED ORDER — EPHEDRINE 5 MG/ML INJ
INTRAVENOUS | Status: AC
  Filled 2024-10-21: qty 5

## 2024-10-21 MED ORDER — EPHEDRINE SULFATE (PRESSORS) 25 MG/5ML IV SOSY
PREFILLED_SYRINGE | INTRAVENOUS | Status: DC | PRN
  Administered 2024-10-21: 10 mg via INTRAVENOUS
  Administered 2024-10-21: 5 mg via INTRAVENOUS

## 2024-10-21 MED ORDER — DROPERIDOL 2.5 MG/ML IJ SOLN: 0.6250 mg | Freq: Once | INTRAMUSCULAR | Status: DC | PRN

## 2024-10-21 MED ORDER — GABAPENTIN 300 MG PO CAPS
ORAL_CAPSULE | ORAL | Status: AC
  Filled 2024-10-21: qty 1

## 2024-10-21 MED ORDER — FENTANYL CITRATE (PF) 100 MCG/2ML IJ SOLN
INTRAMUSCULAR | Status: AC
  Filled 2024-10-21: qty 2

## 2024-10-21 MED ORDER — OXYCODONE HCL 5 MG PO TABS: 5.0000 mg | ORAL_TABLET | Freq: Once | ORAL | Status: DC | PRN

## 2024-10-21 MED ORDER — OXYCODONE HCL 5 MG/5ML PO SOLN: 5.0000 mg | Freq: Once | ORAL | Status: DC | PRN

## 2024-10-21 MED ORDER — ACETAMINOPHEN 500 MG PO TABS
1000.0000 mg | ORAL_TABLET | Freq: Once | ORAL | Status: AC
  Administered 2024-10-21: 1000 mg via ORAL

## 2024-10-21 MED ORDER — SODIUM CHLORIDE 0.9 % IR SOLN
Status: DC | PRN
  Administered 2024-10-21: 1000 mL

## 2024-10-21 MED ORDER — LIDOCAINE HCL (CARDIAC) PF 100 MG/5ML IV SOSY
PREFILLED_SYRINGE | INTRAVENOUS | Status: DC | PRN
  Administered 2024-10-21: 100 mg via INTRAVENOUS

## 2024-10-21 MED ORDER — ACETAMINOPHEN 500 MG PO TABS: 500.0000 mg | ORAL_TABLET | Freq: Four times a day (QID) | ORAL | 2 refills | Status: AC | PRN

## 2024-10-21 MED ORDER — DEXAMETHASONE SOD PHOSPHATE PF 10 MG/ML IJ SOLN
INTRAMUSCULAR | Status: DC | PRN
  Administered 2024-10-21: 10 mg via INTRAVENOUS

## 2024-10-21 MED ORDER — VISTASEAL 10 ML SINGLE DOSE KIT
PACK | CUTANEOUS | Status: DC | PRN
  Administered 2024-10-21: 10 mL via TOPICAL

## 2024-10-21 MED ORDER — ROCURONIUM BROMIDE 10 MG/ML (PF) SYRINGE
PREFILLED_SYRINGE | INTRAVENOUS | Status: AC
  Filled 2024-10-21: qty 10

## 2024-10-21 MED ORDER — ACETAMINOPHEN 500 MG PO TABS: 1000.0000 mg | ORAL_TABLET | ORAL | Status: AC

## 2024-10-21 MED ORDER — HEMOSTATIC AGENTS (NO CHARGE) OPTIME: TOPICAL | Status: DC | PRN

## 2024-10-21 MED ORDER — ONDANSETRON HCL 4 MG/2ML IJ SOLN
INTRAMUSCULAR | Status: DC | PRN
  Administered 2024-10-21: 4 mg via INTRAVENOUS

## 2024-10-21 MED ORDER — PHENYLEPHRINE 80 MCG/ML (10ML) SYRINGE FOR IV PUSH (FOR BLOOD PRESSURE SUPPORT)
PREFILLED_SYRINGE | INTRAVENOUS | Status: AC
  Filled 2024-10-21: qty 10

## 2024-10-21 MED ORDER — GABAPENTIN 300 MG PO CAPS
300.0000 mg | ORAL_CAPSULE | ORAL | Status: AC
  Administered 2024-10-21: 300 mg via ORAL

## 2024-10-21 SURGICAL SUPPLY — 35 items
APPLICATOR COTTON TIP 6 STRL (MISCELLANEOUS) IMPLANT
APPLICATOR VISTASEAL 35 (MISCELLANEOUS) IMPLANT
BLADE CLIPPER SURG (BLADE) IMPLANT
CANISTER SUCT 1200ML W/VALVE (MISCELLANEOUS) IMPLANT
CHLORAPREP W/TINT 26 (MISCELLANEOUS) ×1 IMPLANT
CLIP APPLIE 5 13 M/L LIGAMAX5 (MISCELLANEOUS) IMPLANT
DERMABOND ADVANCED .7 DNX12 (GAUZE/BANDAGES/DRESSINGS) ×1 IMPLANT
DEVICE SECURE STRAP 25 ABSORB (INSTRUMENTS) ×1 IMPLANT
DISSECTOR BALLN SPACEMKR + OVL (BALLOONS) IMPLANT
DISSECTOR BLUNT TIP ENDO 5MM (MISCELLANEOUS) IMPLANT
ELECTRODE REM PT RTRN 9FT ADLT (ELECTROSURGICAL) ×1 IMPLANT
GAUZE 4X4 16PLY ~~LOC~~+RFID DBL (SPONGE) IMPLANT
GLOVE BIO SURGEON STRL SZ8 (GLOVE) ×1 IMPLANT
GLOVE BIOGEL PI IND STRL 8 (GLOVE) ×1 IMPLANT
GOWN STRL REUS W/ TWL LRG LVL3 (GOWN DISPOSABLE) ×1 IMPLANT
GOWN STRL REUS W/ TWL XL LVL3 (GOWN DISPOSABLE) ×1 IMPLANT
IRRIGATION SUCT STRKRFLW 2 WTP (MISCELLANEOUS) IMPLANT
MESH OVITEX PERM 10X17 3L (Mesh General) IMPLANT
PACK BASIN DAY SURGERY FS (CUSTOM PROCEDURE TRAY) ×1 IMPLANT
SCISSORS LAP 5X35 DISP (ENDOMECHANICALS) IMPLANT
SET TUBE SMOKE EVAC HIGH FLOW (TUBING) ×1 IMPLANT
SLEEVE SCD COMPRESS KNEE MED (STOCKING) ×1 IMPLANT
SLEEVE Z-THREAD 5X100MM (TROCAR) ×1 IMPLANT
SOLN 0.9% NACL POUR BTL 1000ML (IV SOLUTION) ×1 IMPLANT
SUT MNCRL AB 4-0 PS2 18 (SUTURE) ×1 IMPLANT
SUT VICRYL 0 UR6 27IN ABS (SUTURE) IMPLANT
SYR TOOMEY 50ML (SYRINGE) IMPLANT
TOWEL GREEN STERILE FF (TOWEL DISPOSABLE) ×2 IMPLANT
TRAY FOL W/BAG SLVR 16FR STRL (SET/KITS/TRAYS/PACK) ×1 IMPLANT
TRAY FOLEY W/BAG SLVR 14FR LF (SET/KITS/TRAYS/PACK) ×1 IMPLANT
TRAY LAPAROSCOPIC (CUSTOM PROCEDURE TRAY) ×1 IMPLANT
TROCAR BALLN 12MMX100 BLUNT (TROCAR) ×1 IMPLANT
TROCAR Z-THREAD OPTICAL 5X100M (TROCAR) ×1 IMPLANT
TUBE CONNECTING 20X1/4 (TUBING) IMPLANT
YANKAUER SUCT BULB TIP NO VENT (SUCTIONS) IMPLANT

## 2024-10-21 NOTE — Transfer of Care (Signed)
 Immediate Anesthesia Transfer of Care Note  Patient: Miguel Brooks  Procedure(s) Performed: REPAIR, HERNIA, INGUINAL, LAPAROSCOPIC (Bilateral)  Patient Location: PACU  Anesthesia Type:General  Level of Consciousness: drowsy, patient cooperative, and responds to stimulation  Airway & Oxygen Therapy: Patient Spontanous Breathing and Patient connected to face mask oxygen  Post-op Assessment: Report given to RN and Post -op Vital signs reviewed and stable  Post vital signs: Reviewed and stable  Last Vitals:  Vitals Value Taken Time  BP 136/87 10/21/24 12:51  Temp    Pulse 85 10/21/24 12:55  Resp 28 10/21/24 12:55  SpO2 94 % 10/21/24 12:55  Vitals shown include unfiled device data.  Last Pain:  Vitals:   10/21/24 0813  TempSrc: Temporal  PainSc: 0-No pain      Patients Stated Pain Goal: 4 (10/21/24 0813)  Complications: No notable events documented.

## 2024-10-21 NOTE — Interval H&P Note (Signed)
 History and Physical Interval Note:  10/21/2024 8:52 AM  Miguel Brooks  has presented today for surgery, with the diagnosis of LEFT INGUINAL HERNIA.  The various methods of treatment have been discussed with the patient and family. After consideration of risks, benefits and other options for treatment, the patient has consented to  Procedures with comments: REPAIR, HERNIA, INGUINAL, LAPAROSCOPIC (Left) - LAPAROSCOPIC LEFT INGUINAL HERNIA REPAIR WITH MESH as a surgical intervention.  The patient's history has been reviewed, patient examined, no change in status, stable for surgery.  I have reviewed the patient's chart and labs.  Questions were answered to the patient's satisfaction.   The risk of hernia repair include bleeding,  Infection,   Recurrence of the hernia,  Mesh use, chronic pain,  Organ injury,  Bowel injury,  Bladder injury,   nerve injury with numbness around the incision,  Death,  and worsening of preexisting  medical problems.  The alternatives to surgery have been discussed as well..  Long term expectations of both operative and non operative treatments have been discussed.   The patient agrees to proceed.   Bre Pecina A Nicky Kras

## 2024-10-21 NOTE — Anesthesia Procedure Notes (Signed)
 Procedure Name: Intubation Date/Time: 10/21/2024 9:43 AM  Performed by: Frost Kayla MATSU, CRNAPre-anesthesia Checklist: Patient identified, Emergency Drugs available, Suction available and Patient being monitored Patient Re-evaluated:Patient Re-evaluated prior to induction Oxygen Delivery Method: Circle system utilized Preoxygenation: Pre-oxygenation with 100% oxygen Induction Type: IV induction Ventilation: Mask ventilation without difficulty Laryngoscope Size: Miller and 3 Grade View: Grade II Tube type: Oral Tube size: 7.5 mm Number of attempts: 1 Airway Equipment and Method: Stylet and Oral airway Placement Confirmation: ETT inserted through vocal cords under direct vision, positive ETCO2 and breath sounds checked- equal and bilateral Secured at: 22 cm Tube secured with: Tape Dental Injury: Teeth and Oropharynx as per pre-operative assessment

## 2024-10-21 NOTE — H&P (Signed)
 Chief Complaint: New Consultation ( NEW PATIENT - left ing hernia)  History of Present Illness: Miguel Brooks is a 64 y.o. male who is seen today as an office consultation for evaluation of New Consultation ( NEW PATIENT - left ing hernia)  Patient presents for evaluation of left groin and left testicular pain. He has had this for a couple of months. It is getting worse. Locations left groin and left testicle. He underwent evaluation by his PCP who treated him for epididymitis without improvement. Ultrasound showed no testicular pathology but showed a small left inguinal hernia. He has burning sensation especially for prolonged standing. No difficulty with bowel or bladder function. Denies dysuria. No penile discharge noted. Normal bowel function. The pain is constant but fluctuates from 3 to 4-7 out of 10 and is not improving with changes in activity or NSAIDs.  Review of Systems: A complete review of systems was obtained from the patient. I have reviewed this information and discussed as appropriate with the patient. See HPI as well for other ROS.    Medical History: Past Medical History:  Diagnosis Date  Anxiety  Insomnia   There is no problem list on file for this patient.  History reviewed. No pertinent surgical history.   Allergies  Allergen Reactions  Haemophilus Influenzae Type B Other (See Comments)  Headache, epistaxis, high blood pressure. See office visit 08/10/2022  Naproxen Hives and Swelling   Current Outpatient Medications on File Prior to Visit  Medication Sig Dispense Refill  amLODIPine  (NORVASC ) 10 MG tablet Take 10 mg by mouth once daily  atorvastatin  (LIPITOR) 20 MG tablet Take 20 mg by mouth at bedtime  zolpidem  (AMBIEN ) 10 mg tablet Take 10 mg by mouth at bedtime as needed for Sleep (Patient not taking: Reported on 09/19/2024)   No current facility-administered medications on file prior to visit.   Family History  Problem Relation Age of Onset  Myocardial  Infarction (Heart attack) Mother  Other (Mixofibrosarcoma) Father  Diabetes Father  Colon cancer Neg Hx  Prostate cancer Neg Hx  Stroke Neg Hx    Social History   Tobacco Use  Smoking Status Never  Smokeless Tobacco Never    Social History   Socioeconomic History  Marital status: Married  Tobacco Use  Smoking status: Never  Smokeless tobacco: Never  Substance and Sexual Activity  Alcohol use: Never  Drug use: Never  Sexual activity: Yes  Partners: Female   Social Drivers of Corporate Investment Banker Strain: Low Risk (07/30/2024)  Received from [facility]  Overall Financial Resource Strain (CARDIA)  How hard is it for you to pay for the very basics like food, housing, medical care, and heating?: Not hard at all  Food Insecurity: No Food Insecurity (07/30/2024)  Received from [facility]  Hunger Vital Sign  Within the past 12 months, you worried that your food would run out before you got the money to buy more.: Never true  Within the past 12 months, the food you bought just didn't last and you didn't have money to get more.: Never true  Transportation Needs: No Transportation Needs (07/30/2024)  Received from [facility]  In the past 12 months, has lack of transportation kept you from medical appointments or from getting medications?: No  In the past 12 months, has lack of transportation kept you from meetings, work, or from getting things needed for daily living?: No  Physical Activity: Sufficiently Active (07/30/2024)  Received from [facility]  Exercise Vital Sign  On average, how many days per week do you engage in moderate to strenuous exercise (like a brisk walk)?: 6 days  On average, how many minutes do you engage in exercise at this level?: 30 min  Stress: No Stress Concern Present (07/30/2024)  Received from [facility] - Occupational Stress Questionnaire  Do you feel stress - tense,  restless, nervous, or anxious, or unable to sleep at night because your mind is troubled all the time - these days?: Only a little  Social Connections: Socially Integrated (07/30/2024)  Received from [facility]  Social Connection and Isolation Panel  In a typical week, how many times do you talk on the phone with family, friends, or neighbors?: More than three times a week  How often do you get together with friends or relatives?: Twice a week  How often do you attend church or religious services?: More than 4 times per year  Do you belong to any clubs or organizations such as church groups, unions, fraternal or athletic groups, or school groups?: Yes  How often do you attend meetings of the clubs or organizations you belong to?: More than 4 times per year  Are you married, widowed, divorced, separated, never married, or living with a partner?: Married  Housing Stability: Unknown (09/19/2024)  Housing Stability Vital Sign  Homeless in the Last Year: No   Objective:   Vitals:  09/19/24 0923  BP: 135/81  Pulse: 88  Temp: 36.9 C (98.4 F)  TempSrc: Temporal  SpO2: 97%  Weight: 88.5 kg (195 lb 3.2 oz)  Height: 180.3 cm (5' 11)  PainSc: 7  PainLoc: Groin   Body mass index is 27.22 kg/m.  Physical Exam Vitals reviewed.  Cardiovascular:  Rate and Rhythm: Normal rate.  Pulmonary:  Effort: Pulmonary effort is normal.  Abdominal:  Tenderness: There is no abdominal tenderness.  Hernia: A hernia is present. Hernia is present in the left inguinal area. There is no hernia in the umbilical area or right inguinal area.  Musculoskeletal:  Cervical back: Normal range of motion.  Skin: General: Skin is warm.  Neurological:  General: No focal deficit present.  Mental Status: He is alert.     Labs, Imaging and Diagnostic Testing: ULTRASOUND SCROTUM/TESTICLES WITH DOPPLER FLOW EVALUATION 09/15/2024 08:20:54 AM  TECHNIQUE: Duplex ultrasound using B-mode/gray scaled imaging, Doppler  spectral analysis and color flow Doppler was obtained of the testicles.  COMPARISON: None available.  CLINICAL HISTORY: Testicular pain.  FINDINGS:  RIGHT: GREY SCALE: The right testicle measures 5.4 x 2.6 x 3.1 cm. It demonstrates normal homogeneous echotexture without focal lesion. No testicular microlithiasis.  DOPPLER EVALUATION: There is normal arterial and venous Doppler flow within the testicle.  VARICOCELE: No scrotal varicocele.  SCROTAL SAC: Mild-to-moderate thickening of the scrotal wall present on the right. No hydrocele.  EPIDIDYMIS: No acute abnormality.  LEFT: GREY SCALE: The left testicle measures 5.4 x 2.6 x 3.1 cm. It demonstrates normal homogeneous echotexture without focal lesion. No testicular microlithiasis.  DOPPLER EVALUATION: There is normal arterial and venous Doppler flow within the testicle.  VARICOCELE: No scrotal varicocele.  SCROTAL SAC: There is a small left inguinal hernia which expands with Valsalva. No hydrocele.  EPIDIDYMIS: No acute abnormality.  IMPRESSION: 1. Mild-to-moderate thickening of the scrotal wall on the right. 2. Small left inguinal hernia which expands with valsalva.  Electronically signed by: Evalene Coho MD 09/15/2024 09:12 AM EST RP Workstation: [id number] Procedure Note  [facility]-  ULTRASOUND - 09/15/2024 Formatting of this note might be different from the original. EXAM: ULTRASOUND SCROTUM/TESTICLES WITH DOPPLER FLOW EVALUATION 09/15/2024 08:20:54 AM  TECHNIQUE: Duplex ultrasound using B-mode/gray scaled imaging, Doppler spectral analysis and color flow Doppler was obtained of the testicles.  COMPARISON: None available.  CLINICAL HISTORY: Testicular pain.  FINDINGS:  RIGHT: GREY SCALE: The right testicle measures 5.4 x 2.6 x 3.1 cm. It demonstrates normal homogeneous echotexture without focal lesion. No testicular microlithiasis.  DOPPLER EVALUATION: There is normal arterial and venous  Doppler flow within the testicle.  VARICOCELE: No scrotal varicocele.  SCROTAL SAC: Mild-to-moderate thickening of the scrotal wall present on the right. No hydrocele.  EPIDIDYMIS: No acute abnormality.  LEFT: GREY SCALE: The left testicle measures 5.4 x 2.6 x 3.1 cm. It demonstrates normal homogeneous echotexture without focal lesion. No testicular microlithiasis.  DOPPLER EVALUATION: There is normal arterial and venous Doppler flow within the testicle.  VARICOCELE: No scrotal varicocele.  SCROTAL SAC: There is a small left inguinal hernia which expands with Valsalva. No hydrocele.  EPIDIDYMIS: No acute abnormality.  IMPRESSION: 1. Mild-to-moderate thickening of the scrotal wall on the right. 2. Small left inguinal hernia which expands with valsalva.   Assessment and Plan:   Diagnoses and all orders for this visit:  Left inguinal hernia  The patient has a small left inguinal hernia. I am not sure this explains all his testicle pain but certainly can be a contributing factor. I explained this to the patient as well as the fact he may still have some residual pain after surgery. Discussed laparoscopic and open techniques. After reviewing the above, the patient wishes to proceed with a laparoscopic left inguinal hernia pair with mesh. He also stated if either right inguinal hernia is discovered this should be repaired at the same time. I discussed that as well as well as complications of repair and contrasting it open repairs and expected recovery. Long-term recovery, local regional recurrence and quality of life was all reviewed with today with the patient.The risk of hernia repair include bleeding, Infection, Recurrence of the hernia, Mesh use, chronic pain, Organ injury, Bowel injury, Bladder injury, nerve injury with numbness around the incision, Death, and worsening of preexisting medical problems. The alternatives to surgery have been discussed as well.. Long term  expectations of both operative and non operative treatments have been discussed. The patient agrees to proceed.    DEBBY CURTISTINE SHIPPER, MD

## 2024-10-21 NOTE — Discharge Instructions (Addendum)
 [facility]__[facility]  UMBILICAL OR INGUINAL HERNIA REPAIR: POST OP INSTRUCTIONS  Always review your discharge instruction sheet given to you by the facility where your surgery was performed. IF YOU HAVE DISABILITY OR FAMILY LEAVE FORMS, YOU MUST BRING THEM TO THE OFFICE FOR PROCESSING.   DO NOT GIVE THEM TO YOUR DOCTOR.  1. A  prescription for pain medication may be given to you upon discharge.  Take your pain medication as prescribed, if needed.  If narcotic pain medicine is not needed, then you may take acetaminophen  (Tylenol ) or ibuprofen (Advil) as needed. 2. Take your usually prescribed medications unless otherwise directed. If you need a refill on your pain medication, please contact your pharmacy.  They will contact our office to request authorization. Prescriptions will not be filled after 5 pm or on week-ends. 3. You should follow a light diet the first 24 hours after arrival home, such as soup and crackers, etc.  Be sure to include lots of fluids daily.  Resume your normal diet the day after surgery. 4.Most patients will experience some swelling and bruising around the umbilicus or in the groin and scrotum.  Ice packs and reclining will help.  Swelling and bruising can take several days to resolve.  6. It is common to experience some constipation if taking pain medication after surgery.  Increasing fluid intake and taking a stool softener (such as Colace) will usually help or prevent this problem from occurring.  A mild laxative (Milk of Magnesia or Miralax) should be taken according to package directions if there are no bowel movements after 48 hours. 7. Unless discharge instructions indicate otherwise, you may remove your bandages 24-48 hours after surgery, and you may shower at that time.  You may have steri-strips (small skin tapes) in place directly over the incision.  These strips should be left on the skin for 7-10 days.  If your surgeon used skin glue on the  incision, you may shower in 24 hours.  The glue will flake off over the next 2-3 weeks.  Any sutures or staples will be removed at the office during your follow-up visit. 8. ACTIVITIES:  You may resume regular (light) daily activities beginning the next day--such as daily self-care, walking, climbing stairs--gradually increasing activities as tolerated.  You may have sexual intercourse when it is comfortable.  Refrain from any heavy lifting or straining until approved by your doctor.  a.You may drive when you are no longer taking prescription pain medication, you can comfortably wear a seatbelt, and you can safely maneuver your car and apply brakes. b.RETURN TO WORK:   _____________________________________________  9.You should see your doctor in the office for a follow-up appointment approximately 2-3 weeks after your surgery.  Make sure that you call for this appointment within a day or two after you arrive home to insure a convenient appointment time. 10.OTHER INSTRUCTIONS: _________________________    _____________________________________  WHEN TO CALL YOUR DOCTOR: Fever over 101.0 Inability to urinate Nausea and/or vomiting Extreme swelling or bruising Continued bleeding from incision. Increased pain, redness, or drainage from the incision  The clinic staff is available to answer your questions during regular business hours.  Please dont hesitate to call and ask to speak to one of the nurses for clinical concerns.  If you have a medical emergency, go to the nearest emergency room or call 911.  A surgeon from [facility] is always on call at the hospital   [address], [address],  [place], [place]  [address] ?  [address] [address], [place], [place]   [address] [phone] ? [phone] ? FAX [phone] Web site: [email or web address]    No Tylenol  before 2:15pm.   Post Anesthesia Home Care Instructions  Activity: Get plenty of rest for the remainder  of the day. A responsible individual must stay with you for 24 hours following the procedure.  For the next 24 hours, DO NOT: -Drive a car -Advertising copywriter -Drink alcoholic beverages -Take any medication unless instructed by your physician -Make any legal decisions or sign important papers.  Meals: Start with liquid foods such as gelatin or soup. Progress to regular foods as tolerated. Avoid greasy, spicy, heavy foods. If nausea and/or vomiting occur, drink only clear liquids until the nausea and/or vomiting subsides. Call your physician if vomiting continues.  Special Instructions/Symptoms: Your throat may feel dry or sore from the anesthesia or the breathing tube placed in your throat during surgery. If this causes discomfort, gargle with warm salt water. The discomfort should disappear within 24 hours.

## 2024-10-21 NOTE — Op Note (Signed)
 Preoperative diagnosis: Reducible left inguinal hernia initial nonobstructed  Postoperative diagnosis: Bilateral inguinal hernia indirect reducible initial without obstruction or gangrene  Procedure: Laparoscopic repair of bilateral inguinal hernia with mesh  Surgeon: Debby Shipper, MD  Anesthesia: General  EBL: Minimal  Drains: None  Indications for procedure: The patient is a 64 year old male who has a left inguinal hernia and symptomatic.  He presents for laparoscopic  Reviewing all of his options.  Risk of bleeding, infection, bladder injury, bowel injury, nerve injury injury, recurrence, injury to abdominal wall, the need further treatment standard procedures reviewed with the patient.  Use of mesh reviewed.The risk of hernia repair include bleeding,  Infection,   Recurrence of the hernia,  Mesh use, chronic pain,  Organ injury,  Bowel injury,  Bladder injury,   nerve injury with numbness around the incision,  Death,  and worsening of preexisting  medical problems.  The alternatives to surgery have been discussed as well..  Long term expectations of both operative and non operative treatments have been discussed.   The patient agrees to proceed.     Description of procedure: The patient was met in the holding area questions were answered.  He was taken to the operative room placed supine upon the operating table.  After induction of general anesthesia, a Foley catheter was placed under sterile conditions and both arms were tucked.  The abdomen was prepped and draped in a sterile fashion and a timeout was performed.  A 1 cm infraumbilical incision was made dissection was carried down to the midline fascia and this was opened to enter the preperitoneal space.  This was opened in abdominal cavity and a 12 mm balloontipped catheter was placed and secured with a pursestring suture of 0 Vicryl.  Pneumoperitoneum was created to 15 mmHg of pressure.  Was placed in Trendelenburg.  2 additional 5 mm  ports were placed 1 in the right lower quadrant and the other left lower quadrant.  Laparoscopy performed.  There is no evidence of bowel injury or injury to internal viscera with insertion of these ports.  The bladder was decompressed well.  He did have some chronic changes of diverticulitis in the left colon but no acute disease or active disease noted.  Upon examination had a left inguinal hernia which was small but he also had a small right inguinal hernia.  I reviewed with him the possibility of this happening and discussed with him repair of both which she had agreed to preoperatively if discovered.  Hook cautery was used to create a flap in the peritoneum.  This dissected to the left down to the left cord structures as well to the right cord structures.  This was then taken and across the midline.  This was taken down to the pubic symphysis.  The bladder was well away from our dissection.  Once this the peritoneal flap was encountered I dissected around both cord structures to encircle them.  The indirect sac was reduced back off the cord and the peritoneum was pulled back as far as we can get it to prevent recurrence. Ovitex 1 S 10 x 16 cm mesh was used.  Piece was placed on both sides.  The medial portion mass was marked.  This was placed down by Coopers ligament and secured with secure straps bilaterally.  The mesh was deployed around the defect nicely and around the cord structures carefully.  Vistaseal  glue was used as tacks were placed intermittently.  Tacks were not placed laterally nor inferiorly.  The epigastric vessels were avoided with tacks.  Both pieces of mesh were secured in a similar fashion with the medial portion and secured to Cooper's ligament.  Care was taken not to injure the bladder.  1 both pieces of meshes were deployed the indirect defect and direct areas were covered well with good overlap.  There is no signs of bleeding.  We then secured the peritoneum back over the mesh with the  secure strap tacker.  Examination abdominal cavity is done after the procedure and there is no evidence of bowel injury or injury to the bladder.  There is no bleeding.  At this portion the case the laparoscopic ports were removed expressing the carbon oxide from the scrotum and penis.  The preperitoneal space was soft.  Once this was expressed out of the patient, ports were removed.  Fascia closed with 0 Vicryl.  4-0 Monocryl used to close the skin.  The urine was clear at the end of the case.  The catheter was removed.  All counts were found to be correct.  The patient was awoke extubated taken to recovery in satisfactory condition.

## 2024-10-21 NOTE — Anesthesia Postprocedure Evaluation (Signed)
"   Anesthesia Post Note  Patient: Miguel Brooks  Procedure(s) Performed: REPAIR, HERNIA, INGUINAL, LAPAROSCOPIC (Bilateral)     Patient location during evaluation: PACU Anesthesia Type: General Level of consciousness: awake and alert Pain management: pain level controlled Vital Signs Assessment: post-procedure vital signs reviewed and stable Respiratory status: spontaneous breathing, nonlabored ventilation and respiratory function stable Cardiovascular status: blood pressure returned to baseline Postop Assessment: no apparent nausea or vomiting Anesthetic complications: no   No notable events documented.  Last Vitals:  Vitals:   10/21/24 1345 10/21/24 1405  BP: (!) 101/54 131/72  Pulse: 80 82  Resp: 15 18  Temp:  36.9 C  SpO2: 94% 95%    Last Pain:  Vitals:   10/21/24 1345  TempSrc:   PainSc: 4                  Vertell Row      "

## 2024-10-22 ENCOUNTER — Encounter (HOSPITAL_BASED_OUTPATIENT_CLINIC_OR_DEPARTMENT_OTHER): Payer: Self-pay | Admitting: Surgery

## 2024-10-24 ENCOUNTER — Other Ambulatory Visit: Payer: Self-pay | Admitting: Internal Medicine

## 2024-10-24 NOTE — Telephone Encounter (Signed)
 Requesting: Ambien  10mg   Contract:08/08/21 UDS:07/27/22 Last Visit: 07/31/24 Next Visit: 12/26/24 Last Refill: 08/07/24 #30 and 1rf   Please Advise

## 2024-10-24 NOTE — Telephone Encounter (Signed)
 PDMP okay, I noticed he has been prescribed pain medication for hernia repair. Please send the patient a message, prescription refilled.  Do not take it along with pain medicine.

## 2024-10-31 NOTE — Progress Notes (Signed)
" ° °  PROVIDER:  PUJA GOSAI MACZIS, PA  MRN: [id number] DOB: [birth year] DATE OF ENCOUNTER: 10/31/2024 Interval History:   Miguel Brooks is a 64 y.o. male who underwent laparoscopic repair of bilateral inguinal hernia with mesh on 10/21/2024 by Dr. Vanderbilt.  He is presenting today due to concerns of swelling in the lower abdomen and left groin which she noticed yesterday after being more active.  He states that he has occasional pains in his left and right testicle.  He denies nausea, vomiting, fever.  Denies constipation/diarrhea.  Review of Systems:   ROS All other systems reviewed and are negative.  Medications:   Current Outpatient Medications on File Prior to Visit  Medication Sig Dispense Refill   amLODIPine  (NORVASC ) 10 MG tablet Take 10 mg by mouth once daily     atorvastatin  (LIPITOR) 20 MG tablet Take 20 mg by mouth at bedtime     zolpidem  (AMBIEN ) 10 mg tablet Take 10 mg by mouth at bedtime as needed for Sleep     No current facility-administered medications on file prior to visit.    Physical Examination:   BP 127/83   Pulse 98   Temp 36.9 C (98.4 F) (Temporal)   Ht 180.3 cm (5' 11)   Wt 87.2 kg (192 lb 3.2 oz)   SpO2 98%   BMI 26.81 kg/m   General: Well-developed, well-nourished, in no acute distress.   Abdomen: Incisions are clean dry intact without erythema or drainage.  No obvious swelling, ecchymosis on lower abdomen or bilateral groins    Assessment and Plan:   Diagnoses and all orders for this visit:  Left inguinal hernia  S/P right inguinal hernia repair  Post-operative state    Miguel Brooks is status post laparoscopic repair of bilateral inguinal hernia with mesh on 10/21/2024 by Dr. Vanderbilt.  On exam, he does not have any signs of postop complication or infection.  He will continue to avoid lifting more than 10 to 15 pounds for 6 weeks total and will follow-up as planned.  Return for appt with surgeon as planned.  Puja Maczis, [facility] "

## 2024-11-12 ENCOUNTER — Other Ambulatory Visit: Payer: Self-pay | Admitting: Surgery

## 2024-11-12 DIAGNOSIS — R1030 Lower abdominal pain, unspecified: Secondary | ICD-10-CM

## 2024-11-13 ENCOUNTER — Inpatient Hospital Stay
Admission: RE | Admit: 2024-11-13 | Discharge: 2024-11-13 | Disposition: A | Source: Ambulatory Visit | Attending: Surgery | Admitting: Surgery

## 2024-11-13 DIAGNOSIS — R1030 Lower abdominal pain, unspecified: Secondary | ICD-10-CM

## 2024-11-13 MED ORDER — IOPAMIDOL (ISOVUE-300) INJECTION 61%
100.0000 mL | Freq: Once | INTRAVENOUS | Status: AC | PRN
  Administered 2024-11-13: 100 mL via INTRAVENOUS

## 2024-12-26 ENCOUNTER — Encounter: Admitting: Internal Medicine
